# Patient Record
Sex: Female | Born: 1970 | Race: White | Hispanic: No | Marital: Married | Smoking: Never smoker
Health system: Southern US, Community
[De-identification: ages and names within clinical notes are randomized; demographics above are authoritative.]

## PROBLEM LIST (undated history)

## (undated) DIAGNOSIS — I7300 Raynaud's syndrome without gangrene: Secondary | ICD-10-CM

## (undated) HISTORY — DX: Raynaud's syndrome without gangrene: I73.00

---

## 2003-09-29 ENCOUNTER — Encounter: Payer: Self-pay | Admitting: Family Medicine

## 2006-02-16 ENCOUNTER — Other Ambulatory Visit: Admission: RE | Admit: 2006-02-16 | Discharge: 2006-02-16 | Payer: Self-pay | Admitting: Obstetrics and Gynecology

## 2006-05-01 ENCOUNTER — Encounter: Admission: RE | Admit: 2006-05-01 | Discharge: 2006-05-01 | Payer: Self-pay | Admitting: Obstetrics and Gynecology

## 2006-06-01 ENCOUNTER — Ambulatory Visit: Payer: Self-pay | Admitting: Podiatry

## 2006-09-03 ENCOUNTER — Inpatient Hospital Stay (HOSPITAL_COMMUNITY): Admission: AD | Admit: 2006-09-03 | Discharge: 2006-09-07 | Payer: Self-pay | Admitting: Obstetrics and Gynecology

## 2006-09-04 ENCOUNTER — Encounter (INDEPENDENT_AMBULATORY_CARE_PROVIDER_SITE_OTHER): Payer: Self-pay | Admitting: Specialist

## 2006-09-09 ENCOUNTER — Inpatient Hospital Stay (HOSPITAL_COMMUNITY): Admission: AD | Admit: 2006-09-09 | Discharge: 2006-09-09 | Payer: Self-pay | Admitting: Obstetrics and Gynecology

## 2007-08-05 ENCOUNTER — Ambulatory Visit: Payer: Self-pay | Admitting: Family Medicine

## 2007-08-05 DIAGNOSIS — I7300 Raynaud's syndrome without gangrene: Secondary | ICD-10-CM

## 2007-08-05 DIAGNOSIS — M25569 Pain in unspecified knee: Secondary | ICD-10-CM | POA: Insufficient documentation

## 2007-08-05 DIAGNOSIS — R062 Wheezing: Secondary | ICD-10-CM

## 2007-08-10 ENCOUNTER — Encounter: Payer: Self-pay | Admitting: Family Medicine

## 2007-09-15 ENCOUNTER — Telehealth: Payer: Self-pay | Admitting: Family Medicine

## 2008-06-22 ENCOUNTER — Inpatient Hospital Stay (HOSPITAL_COMMUNITY): Admission: RE | Admit: 2008-06-22 | Discharge: 2008-06-26 | Payer: Self-pay | Admitting: Obstetrics and Gynecology

## 2008-06-22 ENCOUNTER — Encounter (INDEPENDENT_AMBULATORY_CARE_PROVIDER_SITE_OTHER): Payer: Self-pay | Admitting: Obstetrics and Gynecology

## 2009-04-17 ENCOUNTER — Ambulatory Visit: Payer: Self-pay | Admitting: Family Medicine

## 2010-07-08 ENCOUNTER — Other Ambulatory Visit: Payer: Self-pay | Admitting: Obstetrics and Gynecology

## 2010-07-08 ENCOUNTER — Encounter (INDEPENDENT_AMBULATORY_CARE_PROVIDER_SITE_OTHER): Payer: Self-pay | Admitting: Obstetrics and Gynecology

## 2010-07-08 ENCOUNTER — Inpatient Hospital Stay (HOSPITAL_COMMUNITY): Admission: AD | Admit: 2010-07-08 | Discharge: 2010-07-11 | Payer: Self-pay | Admitting: Obstetrics and Gynecology

## 2010-09-29 ENCOUNTER — Emergency Department: Payer: Self-pay | Admitting: Emergency Medicine

## 2010-09-30 ENCOUNTER — Telehealth: Payer: Self-pay | Admitting: Family Medicine

## 2010-11-26 NOTE — Progress Notes (Signed)
Summary: call a nurse   Phone Note Call from Patient   Summary of Call: Triage Record Num: [id number] Operator: Jeraldine Loots Patient Name: Ashley [name] Call Date & Time: 09/28/2010 2:49:53PM Patient Phone: [phone] PCP: Kerby Nora Patient Gender: Female PCP Fax : [phone] Patient DOB: [birth year] Practice Name: [facility] Reason for Call: Pt calling, has had a fever x 24 hours and now it is 105 orally. Has had body aches and nasal stuffiness, fatigue. Vomited x 1. She is going to UC now for evaluation. Protocol(s) Used: Flu-Like Symptoms Recommended Outcome per Protocol: See Provider within 24 hours Reason for Outcome: Temperature of 101.5 F (38.6 C) or greater that has not responded to 24 hours of home care measures Care Advice:  ~ SYMPTOM / CONDITION MANAGEMENT  ~ Call EMS 911 if person has a change in level of consciousness. Analgesic/Antipyretic Advice - Acetaminophen: Consider acetaminophen as directed on label or by pharmacist/provider for pain or fever. PRECAUTIONS: - Use only if there is no history of liver disease, alcoholism, or intake of three or more alcohol drinks per day. - If approved by provider when breastfeeding. - Do not exceed recommended dose or frequency.  ~ Speak with a provider within 8 hours if develop flu-like symptoms and are at risk of complications, including individuals over the age of 58, women who are 3 months or more pregnant, living in a long-term care facility, or have a chronic disease (diabetes, lung, heart, or kidney) or a weakened immune system.  ~ Influenza - Transmission: - Flu virus is spread through the air in droplets when a flu-infected person coughs, sneezes or talks and another person breathes in the droplets, or by touching a surface like a door knob, telephone, or keyboard that has been contaminated by the droplets and then touching the mouth, nose, or eyes. - An individual may be passing on the flu before they  have any symptoms. - An individual may continue to be contagious with the flu for up to 7 days after the symptoms start. H1N1 influ Initial call taken by: Melody Comas,  September 30, 2010 8:59 AM  Follow-up for Phone Call        agree Hannah Beat MD  September 30, 2010 9:57 AM

## 2011-01-09 LAB — URINALYSIS, ROUTINE W REFLEX MICROSCOPIC
Bilirubin Urine: NEGATIVE
Nitrite: NEGATIVE
Specific Gravity, Urine: 1.025 (ref 1.005–1.030)

## 2011-01-09 LAB — SURGICAL PCR SCREEN
MRSA, PCR: NEGATIVE
Staphylococcus aureus: POSITIVE — AB

## 2011-01-09 LAB — CBC
HCT: 36.9 % (ref 36.0–46.0)
Hemoglobin: 12.1 g/dL (ref 12.0–15.0)
MCHC: 32.8 g/dL (ref 30.0–36.0)
MCHC: 33.7 g/dL (ref 30.0–36.0)
Platelets: 140 10*3/uL — ABNORMAL LOW (ref 150–400)
RBC: 4.49 MIL/uL (ref 3.87–5.11)
RDW: 15 % (ref 11.5–15.5)

## 2011-01-09 LAB — COMPREHENSIVE METABOLIC PANEL
ALT: 18 U/L (ref 0–35)
AST: 21 U/L (ref 0–37)
Albumin: 3 g/dL — ABNORMAL LOW (ref 3.5–5.2)
CO2: 25 mEq/L (ref 19–32)
Creatinine, Ser: 0.79 mg/dL (ref 0.4–1.2)
GFR calc Af Amer: >60 mL/min (ref >60)
GFR calc non Af Amer: >60 mL/min (ref >60)
Glucose, Bld: 78 mg/dL (ref 70–99)
Total Bilirubin: 0.3 mg/dL (ref 0.3–1.2)
Total Protein: 6.7 g/dL (ref 6.0–8.3)

## 2011-01-09 LAB — URINE MICROSCOPIC-ADD ON

## 2011-03-11 NOTE — Op Note (Signed)
NAMEANELLY, SAMARIN              ACCOUNT NO.:  [id number]   MEDICAL RECORD NO.:  [id number]          PATIENT TYPE:  INP   LOCATION:  9199                          FACILITY:  [facility]   PHYSICIAN:  Hal Morales, M.D.DATE OF BIRTH:  1970/11/16   DATE OF PROCEDURE:  06/22/2008  DATE OF DISCHARGE:                               OPERATIVE REPORT   PREOPERATIVE DIAGNOSES:  1. Twin intrauterine pregnancy at 35 weeks and 5 days.  2. Twin discordance with intrauterine growth restriction of twin B.  3. Preeclampsia  4. Positive antinuclear antibody.  5. Prior cesarean section with desire for repeat cesarean section.  6. Positive anticardiolipin antibody.   OPERATIVE DIAGNOSES:  1. Twin intrauterine pregnancy at 35 weeks and 5 days.  2. Twin discordance with intrauterine growth restriction of twin B.  3. Preeclampsia  4. Positive antinuclear antibody.  5. Prior cesarean section with desire for repeat cesarean section.  6. Positive anticardiolipin antibody.   OPERATION:  Repeat low transverse cesarean section.   SURGEON:  Hal Morales, MD   FIRST ASSISTANT:  Elby Showers. Mayford Knife, CNM   ANESTHESIA:  Spinal.   ESTIMATED BLOOD LOSS:  750 mL.   COMPLICATIONS:  None.   FINDINGS:  The patient was delivered of baby A, a girl whose name is  [place] weighing 5 pounds with Apgars of 8 and 9 at 1 and 5 minutes  respectively.  Baby B was a boy whose name is Jomarie Longs weighing 3 pounds  10 ounces with a Apgars of 8 and 9 at 1 and 5 minutes respectively.  The  uterus, tubes and ovaries were normal for the gravid state.  The lower  uterine segment was markedly thinned.  The placentas each contained an  eccentrically inserted 3-vessel cord.  However, the cord for baby B was  very-very thin   PROCEDURE:  The patient was taken to the operating room after  appropriate identification and placed on the operating table.  After the  placement of a spinal anesthetic, she was placed in the supine  position  with a left lateral tilt.  The abdomen and perineum were prepped with  multiple layers of Betadine and a Foley catheter inserted into the  bladder under sterile conditions then connected to straight drainage.  The abdomen was draped as a sterile field.  The site of the previous  cesarean section incision was infiltrated with 20 mL of 0.25% Marcaine  after assurance of adequate surgical anesthesia.  A transverse and  incision was made at that site and the abdomen opened in layers.  The  peritoneum was entered and the bladder blade placed.  The uterus was  incised approximately 2 cm above the uterovesical fold and that incision  taken laterally on either side bluntly.  Baby A had its membranes  ruptured and was delivered from the occiput transverse position.  She  had a loose nuchal cord.  This was reduced and the infant was delivered,  the cord clamped and cut, then the infant handed off to the awaiting  pediatricians.  Baby B was in an unstable lie and was delivered  after  rupture of membranes by a footling breech presentation.  His cord was  clamped and cut and he was handed off to the awaiting pediatricians.  The uterus was massaged allowing both placentas to spontaneously  separate from the uterus.  The uterine cavity was cleared of all  products of conception.  The cervix was gently dilated.  The uterine  incision was then closed with a running interlocking suture of 0 Vicryl.  An imbricating suture of the 0 Vicryl was placed with adequate  hemostasis.  Copious irrigation was carried out.  The abdominal  peritoneum was then closed with a running suture of 2-0 Vicryl.  The  rectus muscles were irrigated and made hemostatic with Bovie cautery.  The rectus fascia was closed with a running suture of 0 Vicryl then  reinforced on either side of midline with figure-of-eight sutures of 0  Vicryl.  The subcutaneous tissue was copiously irrigated and made  hemostatic with Bovie  cautery.  A subcuticular suture of 3-0 Monocryl  was used to close the skin incision.  Steri-Strips and sterile dressing  were applied.  The patient was taken from the operating room to the  recovery room in satisfactory condition having tolerated the procedure  well with sponge and instrument counts correct.   SPECIMENS TO PATHOLOGY:  Placentas from babies A and B.  The baby A went  to the full-term nursery.  Baby B was transferred to the Neonatal  Intensive Care Unit.      Hal Morales, M.D.  Electronically Signed     VPH/MEDQ  D:  06/22/2008  T:  06/23/2008  Job:  [id number]

## 2011-03-11 NOTE — H&P (Signed)
Ashley [name], Ashley [name]              ACCOUNT NO.:  [id number]   MEDICAL RECORD NO.:  [id number]          PATIENT TYPE:  INP   LOCATION:  9371                          [facility]   PHYSICIAN:  Hal Morales, M.D.DATE OF BIRTH:  11/15/1970   DATE OF ADMISSION:  06/22/2008  DATE OF DISCHARGE:                              HISTORY & PHYSICAL   This is a 40 year old gravida 2, para 1-0-0-1 at 35-5/7 weeks who  presents for C-section secondary to preeclampsia.  She had a 24-hour  urine protein showing 330 mg of protein.  She denies headache or visual  changes.  She reports positive fetal movement. Pregnancy has been  followed by Dr. Pennie Rushing and remarkable for:  1. Dichorionic-diamniotic twins.  2. First trimester spotting.  3. AMA.  4. Previous C-section.  5. Positive ANA and cardiolipin antibodies.  6. History of PIH.  7. History of Raynaud syndrome.   ALLERGIES:  None.   OB HISTORY:  Remarkable for C-section in 2007 of a female infant at 9  weeks' gestation weighing 5 pounds 7 ounces, remarkable for  nonreassuring fetal heart rate and cervical laceration.   MEDICAL HISTORY:  Remarkable for positive ANA and cardiolipin  antibodies, childhood varicella, and occasional UTI.  She had Raynaud  syndrome diagnosed in 2005.   SURGICAL HISTORY:  Remarkable for C-section in 2007.   FAMILY HISTORY:  Remarkable for grandfather and father with heart  disease; brother, father, sister with hypertension; mother with  emphysema; father with diabetes; mother and father and sister with  aneurysms.  Genetic history is remarkable for the patient's age of 80  and father with heart murmur.   SOCIAL HISTORY:  The patient is married to Hedy Camara who is involved  and supportive.  She is of the [employer].  She denies any alcohol,  tobacco, or drug use.   ESTIMATED LABS:  Hemoglobin 12.7, platelets 222.  Blood type AB  positive, antibody screen negative, RPR nonreactive, rubella immune.  Hepatitis negative, HIV negative.  Pap test normal.  Gonorrhea and  chlamydia declined.   HISTORY OF CURRENT PREGNANCY:  The patient entered care at 10 weeks'  gestation.  She was diagnosed with twins at that time that were felt to  be dichorionic-diamniotic.  She had a baseline 24-hour urine that was  normal.  She had first trimester spotting that resolved.  First  trimester screen was normal on both twins.  She was treated for reflux  at 20 weeks.  She had a normal fetal echocardiograms at 25 weeks.  She  had an elevated Glucola with a normal 3-hour GTT.  Ultrasound at 28  weeks showed a 11% difference in fetal sizes with persistence of a  weight discrepancy between babies A and B throughout the remainder of  the pregnancy.She had PIH labs done yesterday and the blood work was  normal, but the 24-hour urine was elevated for protein.   OBJECTIVE DATA:  VITAL SIGNS:  Stable.  Blood pressure is 129 to 133  over 91 to 93.  HEENT:  Within normal limits.  NECK:  Thyroid normal, not enlarged.  CHEST:  Clear to auscultation.  HEART:  Regular rate and rhythm.  ABDOMEN:  Gravid.  EFM shows fetal heart rate better reactive in both  twins with occasional contractions that are mild.  PELVIC:  Deferred.  EXTREMITIES:  Show trace edema with 2+ DTRs and no clonus.   CBC shows hemoglobin 10.4, platelets 187.  Chemistries were all normal  with a uric acid of 6.1.   ASSESSMENT:  1. Twin intrauterine pregnancy at 35-5/7 weeks.  2. Preeclampsia.   PLAN:  Admit to the operating room per Dr. Pennie Rushing, repeat low-  transverse cesarean section per Dr. Pennie Rushing, and further orders to  follow.      Marie L. Mayford Knife, C.N.M.      ______________________________  Hal Morales, M.D.    MLW/MEDQ  D:  06/22/2008  T:  06/23/2008  Job:  [id number]

## 2011-03-11 NOTE — Discharge Summary (Signed)
NAMELEILANA, [name]              ACCOUNT NO.:  [id number]   MEDICAL RECORD NO.:  [id number]          PATIENT TYPE:  INP   LOCATION:  9306                          [facility]   PHYSICIAN:  Ashley [name], M.D.DATE OF BIRTH:  1971-01-27   DATE OF ADMISSION:  06/22/2008  DATE OF DISCHARGE:  06/26/2008                               DISCHARGE SUMMARY   ADMISSION DIAGNOSES:  1. Intrauterine pregnancy twin gestation at 35-5/7 weeks.  2. Preeclampsia.   DISCHARGE DIAGNOSES:  1. Intrauterine pregnancy twin gestation at 35-5/7 weeks.  2. Preeclampsia.  3. Stable status post a primary low transverse cesarean section for      preeclampsia.  Twin discordance with intrauterine growth      retardation of twin B and delivery was baby A August 27 at 29      female infant weighing 5 pounds 1 ounce which was (2290 grams),      length 18 inches with Apgars of 8 and 9, and delivery of twin B.      Note baby girl A went to full-term nursery.  Twin B was delivered      shortly thereafter.  I do not have that time delivery and was a      viable female infant by the name of Ashley [name]. Baby A was a viable      female infant by the name of Ashley [name].  Baby B female weighed 3 pounds      10 ounces with Apgars of 8 and 9 and he was taken to the nursery      secondary to weight.  4. She is breast-feeding and pumping.  5. Anemia without hemodynamic instability.   PROCEDURES:  1. Spinal anesthesia.  2. Repeat low transverse cesarean section.   HOSPITAL COURSE:  [name] is a 40 year old gravida 2, para 1-0-0-1 who  presents on the day of admission at 35-5/7 weeks for a repeat C-section  secondary to preeclampsia.  She has 24-hour urine showing 330 mg of  protein.  At the time of admission she was denying any headaches or  visual changes or right upper quadrant pain.  Reported good fetal  movement and pregnancy followed by the MD service at [facility].  History  remarkable for:  1. Dichorionic/diamniotic  twins.  2. First trimester spotting.  3. Advanced maternal age.  4. Previous C-section.  5. Positive ANA and cardiolipin antibodies.  6. History of PIH.  7. History of Raynaud's disease.   HOSPITAL COURSE:  On the date of admission the patient was prepped for  OR and consented to proceed with a repeat cesarean section secondary to  preeclampsia.  She was taken to the OR where repeat low transverse  cesarean section was performed by Dr. Dierdre Forth with Wynelle Bourgeois, certified nurse midwife assisting.  Procedure was performed  under spinal anesthesia.  Findings were a viable female infant of baby A  by the name of Ashley [name], weight 5 pounds 1 ounce, Apgars 8 and 9 and  subsequently delivery of baby B a female infant viable by the name of  Ashley [name] weighing 3 pounds  10 ounces and also Apgars of 8 and 9.  Placenta  x2 were sent to pathology.  Estimated blood loss was 750 mL.  The  patient tolerated procedure very well.  The patient was taken to PACU in  good condition.  The baby girl A was taken to full-term nursery and baby  boy B was taken to NICU, both in stable condition.  On admission the  patient's CBC showed a white count of 6.8, hemoglobin was 10.4,  platelets were 187.  LFTs were within normal limits.  Uric acid was 6.1  and LDH was 137.  By postoperative day #2 she was feeling well, but  tired.  She was in AICU on magnesium.  Blood pressures were 124/87,  119/77, 122/91, 120/82, 120/82 and 106/74.  SAO2 was 98%.  DTRs were 3+,  no clonus, 1+ edema.  Abdomen soft, appropriately tender.  She had good  urinary output at 2700.  Labs that day; AST was stable 20, ALT was 11  and uric acid was 6.2, LDH 172, hemoglobin was down to 8.8, hematocrit  26.8, platelets were stable at 161 and white count was 8.3.  Dressing  was clean, dry and intact.  Her abdomen was appropriately tender but  soft.  Lochia was within normal limits.  She was to continue her  magnesium and discontinue on a.m.  of postoperative day #2.  The patient  did receive pastoral care visits as well as a social work consult on  postoperative day #1.  By postoperative day #2 she was feeling well.  She was ambulating, voiding spontaneously and tolerating p.o. liquids.  Her magnesium had been discontinued that morning.  She was tolerating  solid foods without difficulties and said she felt much improved after  magnesium was discontinued.  Did not have headache, visual changes,  right upper quadrant epigastric pain, weakness, shortness of breath,  chest pain or any leg pain.  She was breast feeding and pumping without  difficulty.  Twin A remained stable in newborn nursery without problems,  twin B in NICU and the patient was reporting that he was doing well.  She was afebrile.  Her vital signs were stable.  Blood pressures since  magnesium discontinued was 95/54, 119/80, 114/76, 131/91, 119/82 and  117/71.  Her physical exam was within normal and lungs were clear.  Abdomen soft, nontender.  Bowel sounds x4.  Fundus was firm below  umbilicus.  Abdominal incision was clean, dry and intact.  Small lochia  rubra.  She did not have any edema, negative Homan's.  DTRs 1+ with no  clonus.  Her weight today was 71.9 kg.  She had good urinary output.  She was transferred out to the floor.  She remained asymptomatic from  the anemia and plan was made to continue routine postoperative care.  Postoperative day #3 she continued doing well, was ambulating ad lib  without dizziness or syncope.  Pain was well managed with Motrin and one  Percocet per each request.  Breast feeding and pumping with good output.  Denied any PIH signs and symptoms.  Vital signs were afebrile and  stable.  Her blood pressure ranged on postoperative day #3 was 104-157  over 61-93.  Weight was 76.2 that day and had previously been 71.9 kg.  Physical exam was within normal limits.  She had trace edema in her  lower extremities, left was a little bit  more than her right but no  clonus and negative Homan's and DTRs were  1+.  Routine postoperative  care was to continue and thus she was started on Nu-Iron b.i.d. that day  in anticipation of discharge on June 26, 2008.  Postoperative day #4  she continued doing well, had complained of a headache the night before.  Blood pressures had been labile on the morning of postoperative day #4,  she was 98.3, 68 heart rate and blood pressure was 102/69.  Blood  pressure ranged last 24 hours had been 102-140 over diastolics of 69-92.  The patient was continuing to do well.  Lochia was scant.  She was still  up ad lib without any problems and up and down from the NICU, was  appropriately taking care of [facility] and present she was tearful about  going home and twin B still being in the NICU.  Support was given.  She  is getting good output of milk and is pumping and saving some both to  use for [facility] in NICU as well as her daughter.  Her physical exam  remained within normal limits.  The patient was deemed to have received  full benefit of her hospital stay and was discharged home in stable  condition.   PLAN FOR FOLLOW-UP:  The patient will have a blood pressure check here  at the hospital by certified nurse midwife on Wednesday and she is also  to follow up p.r.n.  Discussed with her preeclampsia signs or symptoms  and other warning signs and symptoms as well.  Other discharge  instructions were per [facility] pamphlet.   MEDICATIONS:  1. Motrin 600 mg p.o. q.6 h p.r.n. pain.  2. Percocet 5/325 1-2 tablets p.o. q.4-6 hours p.r.n. moderate to      severe pain.  3. Ferralet 90 one tablet p.o. daily for anemia.  4. Also given a prescription for [employer] [facility] prenatal vitamin 1 tablet      p.o. daily.  5. She is also to obtain stool softener of choice 1 tablet p.o. daily      and may repeat dose in p.m.  She reports she has Colace at home.   She will also have a 6-week appointment that she will call and  make  appointment to the office.  The patient was discharged home in stable  condition.      Candice Denny Levy, CNM      ______________________________  Ashley [name], M.D.    CHS/MEDQ  D:  06/26/2008  T:  06/26/2008  Job:  [id number]

## 2011-03-14 NOTE — Op Note (Signed)
Ashley [name], Ashley [name]              ACCOUNT NO.:  [id number]   MEDICAL RECORD NO.:  [id number]          PATIENT TYPE:  INP   LOCATION:  9106                          [facility]ACILITY[facility]   PHYSICIAN:  Hal Morales, M.D.DATE OF BIRTH:  November 24, 1970   DATE OF PROCEDURE:  09/04/2006  DATE OF DISCHARGE:                               OPERATIVE REPORT   PREOPERATIVE DIAGNOSES:  Intrauterine pregnancy at [redacted] weeks gestation,  positive antinuclear antibody, positive anticardiolipin antibody,  nonreassuring fetal heart rate tracing, pregnancy induced hypertension,  terminal fetal bradycardia.   POSTOPERATIVE DIAGNOSES:  Intrauterine pregnancy at [redacted] weeks gestation,  positive antinuclear antibody, positive anticardiolipin antibody,  nonreassuring fetal heart rate tracing, pregnancy induced hypertension,  terminal fetal bradycardia.   OPERATION:  Primary low transverse cesarean section with repair of  cervical laceration transabdominally and transvaginally.   SURGEON:  Hal Morales, M.D.   FIRST ASSISTANT:  Rhona Leavens, CNM   ANESTHESIA:  Epidural   ESTIMATED BLOOD LOSS:  1100 mL.   COMPLICATIONS:  Left cervical laceration.   FINDINGS:  The patient was delivered of a female infant weighing 5  pounds 7 ounces with Apgars of 8 and 9 at 1 and 5 minutes respectively.  The tubes and ovaries were normal for the gravid state.   PROCEDURE:  The patient was taken to the operating room emergently after  having had the initiation of pushing and resultant decrease in the heart  rate in the 60-70 range without recovery associated with position  changes or IV fluid bolus.  The cervix at that time was completely  dilated and the fetal vertex was between +3 and +4.  A discussion was  held with the patient concerning the possible opportunity for vacuum  assisted vaginal delivery or cesarean section and the patient opted to  proceed with cesarean section after hearing the risks and benefits.  The  patient was moved emergently to the operating suite where the heart rate  was noted to remain in the 60 to 70 range.  She had her labor epidural  in place.  She was placed on the operating table as the labor epidural  was dosed and the abdomen and perineum were prepped in multiple layers  of Betadine.  A Foley catheter was inserted into the bladder and  connected to straight drainage.  Some hematuria was noted at that time.  The abdomen was prepped with multiple layers of Betadine and draped as a  sterile field.  After assurance of adequate anesthesia, the suprapubic  region was infiltrated with 10 mL of 0.25% Marcaine.  A suprapubic  incision was made and the abdomen opened in layers.  The peritoneum was  entered and the bladder blade placed.  The uterus was incised  approximately 2 cm above the uterovesical fold and the vertex was  delivered with the aid of RN who pushed the head back into the cervix  allowing me to deliver the head from the occiput anterior position.  The  nares and pharynx were suctioned and the cord clamped and cut.  The  infant was handed off to the  awaiting pediatricians.  The placenta  separated easily from the uterus and was removed from the operative  field.  The uterine incision was noted to extend down into the left  cervical area and the cervical laceration was then closed with a running  interlocking suture of 0 Vicryl.  The uterine incision was closed with  running interlocking suture of 0 Vicryl and an imbricating suture of 0  Vicryl then placed.  Several hemostatic sutures were placed in the  cervical laceration repaired area for adequate hemostasis.  Copious  irrigation was carried out and the abdominal peritoneum was closed with  a running suture of 2-0 Vicryl.  The rectus fascia was then closed with  a running suture of 0 Vicryl and reinforced on either side of midline.  The subcutaneous tissue was made hemostatic with Bovie cautery and skin  staples  were applied to the skin incision.  A sterile dressing was  applied and the patient placed in lithotomy position for inspection of  the cervix.  A cervical laceration that measured approximately 4 cm was  noted in the left cervical region and this was repaired with interrupted  sutures in a figure-of-eight fashion of 0 Vicryl.  Hemostasis in the  area of the cervical laceration was noted.  However, there was some  bleeding from the dilated edge of the cervix.  2 inch plain vaginal  packing was placed in the vagina and the patient remained stable.  She  was then taken from the operating room to the recovery room in  satisfactory condition having tolerated the procedure well with sponge  and instrument counts correct.   SPECIMENS TO PATHOLOGY:  Placenta.      Hal Morales, M.D.  Electronically Signed     VPH/MEDQ  D:  09/04/2006  T:  09/04/2006  Job:  [id number]

## 2011-03-14 NOTE — H&P (Signed)
NAMELAVANDA, NEVELS              ACCOUNT NO.:  [id number]   MEDICAL RECORD NO.:  [id number]           PATIENT TYPE:   LOCATION:                                FACILITY:  [facility]   PHYSICIAN:  Hal Morales, M.D.DATE OF BIRTH:  [birth year]   DATE OF ADMISSION:  09/03/2006  DATE OF DISCHARGE:                              HISTORY & PHYSICAL   HISTORY:  This is a 40 year old gravida 1, para 0 at 38-4/7 weeks who  presents for induction of labor, secondary to positive ANA syndrome and  hypertension.  She has been followed by Dr. Pennie Rushing and has had a  pregnancy remarkable for:  1) Toxo risk, 2) Raynaud's disease, 3)  Positive ANA, 4) positive anticardiolipin antibodies, 5) Pregnancy  induced hypertension.   ALLERGIES:  None.   OB HISTORY:  The patient is a prima gravida.   PRENATAL LABS:  Hemoglobin 13.1, platelets 208, blood type AB positive,  antibody screen negative, RPR nonreactive, rubella immune, hepatitis  negative, HIV negative, cystic fibrosis negative, toxo negative   MEDICAL HISTORY:  Remarkable for childhood varicella.  She also has a  history of Raynaud's disease diagnosed in 2005.  History of positive ANA  and anticardiolipin antibodies, diagnosed this pregnancy; and recent  onset of hypertension.  Surgical history is remarkable for wisdom teeth  in 1995 and 1996.   FAMILY HISTORY:  Remarkable for grandmother and father with heart  disease.  Mother with emphysema.  Father has diabetes and skin cancer.  Mother and father both smoke.   GENETIC HISTORY:  Remarkable for patient's father with heart murmur.   SOCIAL HISTORY:  The patient is married to Hedy Camara who is involved  and supportive.  They both work as Scientist, research (life sciences).  She is of the  catholic faith.  She denies any alcohol, tobacco, or drug use.  History  of current pregnancy.  The patient entered care at [redacted] weeks gestation.  She had a consultation with [facility] service; and  recommendations  were made.  She had a second trimester  ultrasound which  was normal.  Thrombophilia workup was done showing positive ANA and  positive cardiolipin IgM.  She had an ultrasound at 18 weeks which was  normal; and she had a neurology referral for consultation regarding a  family history of aneurysm to evaluate the patient's risk.  She was seen  at 22 weeks and had some diarrhea issues and some reflux.  She also had  some bronchitis; and those were all treated symptomatically.  CMET was  normal at that time.  She had a fetal echo in August.  She also  fractured her right ankle in August.  Ultrasound done at 25 weeks was  normal and it was repeated at 28 weeks and was also normal showing 81  percentile growth and normal fluid.  She had a normal 1-hour Glucola at  that time.  She began having NSTs at 30 weeks and they were all  reactive.  BPP at 35 weeks was normal; and an ultrasound was also normal  showing growth at 41 percentile.  She  developed some hypertension at 37  weeks 128/84, and had PIH labs done at 38 weeks which were normal.  A 24-  hour urine was also done which showed 225 mg of protein and a creatinine  clearance of 119.  She was placed on level 3 bed rest and is being  admitted for induction.   REVIEW OF SYSTEMS:  Negative.   PHYSICAL EXAMINATION:  VITAL SIGNS:  Stable.  Last blood pressure in the  office was 116/90.  HEENT:  Within normal limits.  NECK:  Thyroid normal, not enlarged.  CHEST:  Clear to auscultation.  HEART:  Regular rate and rhythm.  ABDOMEN:  Gravid at 38 cm.  Vertex Leopold's.  Fetal heart rate reactive  per NST.  CERVICAL EXAM:  At 37 weeks was 1-2 cm, 50% effaced, and minus 2  station.  EXTREMITIES:  Within normal limits.   ASSESSMENT:  1. Intrauterine pregnancy at 38-4/7 weeks.  2. Positive ANA and cardiolipin antibodies.  3. Pregnancy induced hypertension without preeclampsia.   PLAN:  Admit for Cytotec induction with Pitocin in the a.m. and  further  orders to follow.      Marie L. Williams, C.N.M.      Hal Morales, M.D.  Electronically Signed    MLW/MEDQ  D:  09/02/2006  T:  09/02/2006  Job:  [id number]

## 2011-03-14 NOTE — Discharge Summary (Signed)
Ashley [name], Ashley [name]              ACCOUNT NO.:  [id number]   MEDICAL RECORD NO.:  [id number]          PATIENT TYPE:  INP   LOCATION:  9106                          FACILITY:  [facility]   PHYSICIAN:  Crist Fat. Rivard, M.D. DATE OF BIRTH:  1971/02/22   DATE OF ADMISSION:  09/03/2006  DATE OF DISCHARGE:  09/07/2006                                 DISCHARGE SUMMARY   ADMITTING DIAGNOSES:  1. Intrauterine pregnancy at 38 weeks.  2. Positive ANA titer positive.  3. Cardiolipin antibody.  4. Hypertension.  5. For induction of labor.   PREOP DIAGNOSES:  1. Intrauterine pregnancy at 38 weeks.  2. Positive ANA titer positive.  3. Positive cardiolipin antibody.  4. Nonreassuring fetal heart rate tracing and terminal bradycardia.   PROCEDURES:  1. Primary low transverse cesarean section.  2. Repair of cervical laceration, transabdominally and vaginally.   POSTOP DIAGNOSIS:  1. Intrauterine pregnancy at 38 weeks, delivered.  2. Positive ANA titer positive.  3. Positive cardiolipin antibody.  4. Nonreassuring fetal heart rate tracing.  5. Terminal bradycardia.  6. Primary low transverse cesarean section.  7. Repair of cervical lacerations transabdominally and vaginally.  8. Anemia.   LABS:  PIH labs were all within normal limits.   HOSPITAL COURSE:  Ashley [name] is a 40 year old gravida 1, para 0 who was  admitted for induction of labor secondary to positive ANA titer, positive  anticardiolipin antibody and labile blood pressures.  Her blood pressures  remained somewhat elevated through labor; and they were monitored closely.  She received Cytotec in the evening and Pitocin in the morning, when the  patient progressed to 7 cm, artificial rupture of membranes was employed.  Fetal heart rate variable, became severe at that point down to a nadir of  70, requiring 5 minutes to return to the baseline.  The patient did progress  with observation to 10 cm; and she started pushing, when she started  pushing  the fetal heart rate decreased to the 60s-to-70s range.  Options were  discussed with the patient by Dr. Dierdre Forth regarding vacuum versus C-  section.   The patient opted for C-section.  At that point, she pushed one more time  without significant movement; however, the fetal heart rate remained still  in the 60-70 range; and the patient was prepared for STAT cesarean section.  This was accomplished on 09/04/2006 with Dr. Dierdre Forth as surgeon.  The patient gave birth to a 5 pound 7 ounce female infant named, Chartered certified accountant. A  left cervical laceration was also noted requiring repair abdominally and  vaginally.  The vaginal packing was removed later that day.  The patient's  vital signs have remained stable.  She has been afebrile; however on the  first postoperative day her hemoglobin was noted to be 6.0   The patient was given the option of blood transfusion or observation.  She  initially was symptomatic with her anemia; however, this has improved, and  she declined transfusion.  On this her third postoperative day she is judged  to be in satisfactory condition for discharge.   DISCHARGE MEDICATIONS:  1. Motrin 600 mg p.o. q.6 h. p.r.n. pain.  2. Tylox one to two p.o. q.3-4 h. Pain.  3. Prenatal vitamins.  4. The patient will continue her iron supplement twice a day.   DISCHARGE INSTRUCTIONS:  Per [facility] handout.  She will call for  any problems or concerns.  Otherwise she will schedule her 6-week postpartum  [facility].      Rica Koyanagi, C.N.M.      Crist Fat Rivard, M.D.  Electronically Signed    SDM/MEDQ  D:  09/07/2006  T:  09/07/2006  Job:  [id number]

## 2013-09-06 ENCOUNTER — Ambulatory Visit (INDEPENDENT_AMBULATORY_CARE_PROVIDER_SITE_OTHER): Payer: Managed Care, Other (non HMO)

## 2013-09-06 ENCOUNTER — Encounter: Payer: Self-pay | Admitting: Podiatry

## 2013-09-06 ENCOUNTER — Ambulatory Visit (INDEPENDENT_AMBULATORY_CARE_PROVIDER_SITE_OTHER): Payer: Managed Care, Other (non HMO) | Admitting: Podiatry

## 2013-09-06 VITALS — BP 113/70 | HR 86 | Resp 16 | Ht 63.0 in | Wt 155.0 lb

## 2013-09-06 DIAGNOSIS — M79672 Pain in left foot: Secondary | ICD-10-CM

## 2013-09-06 DIAGNOSIS — M79609 Pain in unspecified limb: Secondary | ICD-10-CM

## 2013-09-06 DIAGNOSIS — M2010 Hallux valgus (acquired), unspecified foot: Secondary | ICD-10-CM

## 2013-09-06 NOTE — Progress Notes (Signed)
N HURT L LEFT FOOT BUNION D 30YRS O SUDDEN C WORSE A SHOES T 0

## 2013-09-06 NOTE — Progress Notes (Signed)
Subjective:     Patient ID: Ashley [name], female   DOB: November 27, 1970, 42 y.o.   MRN: [id number]  Foot Pain   patient presents stating the bunion on my left foot has been killing me for the last several years dates that she has tried wider shoes soaks anti-inflammatory's without relief of symptoms and knows that she needs to have something done  Review of Systems  All other systems reviewed and are negative.       Objective:   Physical Exam  Nursing note and vitals reviewed. Constitutional: She is oriented to person, place, and time.  Cardiovascular: Intact distal pulses.   Musculoskeletal: Normal range of motion.  Neurological: She is oriented to person, place, and time.  Skin: Skin is warm.   patient is found to have a large hyperostosis medial aspect first metatarsal head left that is painful and also red on the head of the metatarsal. The right one is minimal and not painful when pressed. 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 X-rays were reviewed and I discussed Austin bunionectomy to correct the structural deformity she has. Patient wants surgery and is going to have this fixed in December and we'll reappoint first week in December with her husband to explain everything associated with this procedure

## 2013-09-30 ENCOUNTER — Ambulatory Visit (INDEPENDENT_AMBULATORY_CARE_PROVIDER_SITE_OTHER): Payer: Managed Care, Other (non HMO) | Admitting: Podiatry

## 2013-09-30 ENCOUNTER — Encounter: Payer: Self-pay | Admitting: Podiatry

## 2013-09-30 VITALS — BP 106/74 | HR 85 | Resp 16 | Ht 63.0 in | Wt 158.0 lb

## 2013-09-30 DIAGNOSIS — M2010 Hallux valgus (acquired), unspecified foot: Secondary | ICD-10-CM

## 2013-10-01 NOTE — Progress Notes (Signed)
Subjective:     Patient ID: Ashley [name], female   DOB: [birth year], 42 y.o.   MRN: [id number]  HPI patient states I am ready to get my left bunion fixed. States it's been sore in making shoe gear or most impossible to wear   Review of Systems     Objective:   Physical Exam Neurovascular status intact with hyperostosis noted first metatarsal head left with redness and pain when palpated    Assessment:     HAV deformity left symptomatic    Plan:     Reviewed condition and discussed treatment options patient wants surgery and at this time is given a consent form for Serafina Royals which she reads Y. byline and reviewed all complications with me as listed today. She understands total recovery. We'll take 6 months to one year and that there is no long-term guarantees as far as success of surgery. Air fracture walker dispensed with all instructions on usage

## 2013-10-06 DIAGNOSIS — M2010 Hallux valgus (acquired), unspecified foot: Secondary | ICD-10-CM

## 2013-10-13 DIAGNOSIS — M2010 Hallux valgus (acquired), unspecified foot: Secondary | ICD-10-CM

## 2013-10-17 ENCOUNTER — Telehealth: Payer: Self-pay | Admitting: *Deleted

## 2013-10-17 NOTE — Telephone Encounter (Signed)
pts husband called and said toes were badly bruised, can feel and wiggle her toes fine. Told pts husband was normal. [place] Spaniel they took the ace wrap off and i told them to make sure they put it back on and elevate foot, ice and stay off foot. Did confirm appt time and date with pts husband as well.

## 2013-10-21 ENCOUNTER — Encounter: Payer: Self-pay | Admitting: Podiatry

## 2013-10-21 ENCOUNTER — Ambulatory Visit (INDEPENDENT_AMBULATORY_CARE_PROVIDER_SITE_OTHER): Payer: Managed Care, Other (non HMO)

## 2013-10-21 ENCOUNTER — Ambulatory Visit (INDEPENDENT_AMBULATORY_CARE_PROVIDER_SITE_OTHER): Payer: Managed Care, Other (non HMO) | Admitting: Podiatry

## 2013-10-21 VITALS — BP 116/72 | HR 86 | Resp 16 | Ht 63.0 in | Wt 158.0 lb

## 2013-10-21 DIAGNOSIS — M21619 Bunion of unspecified foot: Secondary | ICD-10-CM

## 2013-10-21 DIAGNOSIS — M21612 Bunion of left foot: Secondary | ICD-10-CM

## 2013-10-21 DIAGNOSIS — M2010 Hallux valgus (acquired), unspecified foot: Secondary | ICD-10-CM

## 2013-10-21 NOTE — Progress Notes (Signed)
Subjective:     Patient ID: Ashley [name], female   DOB: 1971/01/03, 42 y.o.   MRN: [id number]  HPI patient states I'm doing very well with my foot. States pain is minimal she is walking without discomfort and is utilizing elevation and ibuprofen as needed. One week after foot surgery left   Review of Systems     Objective:   Physical Exam Neurovascular status intact with no health history changes noted and well-healing first MPJ with the hallux be in rectus with good range of motion of the first MPJ    Assessment:     Healing well post Austin osteotomy first metatarsal left foot    Plan:     X-rays reviewed and advised to continue with immobilization and to begin range of motion exercises first MPJ. Sterile dressing reapplied and reappoint 3 weeks earlier if any issues should occur

## 2013-10-31 ENCOUNTER — Encounter: Payer: Self-pay | Admitting: Podiatry

## 2013-11-11 ENCOUNTER — Encounter: Payer: Managed Care, Other (non HMO) | Admitting: Podiatry

## 2013-11-16 ENCOUNTER — Encounter: Payer: Self-pay | Admitting: Podiatry

## 2013-11-18 ENCOUNTER — Encounter: Payer: Self-pay | Admitting: Podiatry

## 2013-11-18 ENCOUNTER — Ambulatory Visit (INDEPENDENT_AMBULATORY_CARE_PROVIDER_SITE_OTHER): Payer: Managed Care, Other (non HMO)

## 2013-11-18 ENCOUNTER — Ambulatory Visit (INDEPENDENT_AMBULATORY_CARE_PROVIDER_SITE_OTHER): Payer: Managed Care, Other (non HMO) | Admitting: Podiatry

## 2013-11-18 VITALS — BP 122/75 | HR 76 | Resp 16

## 2013-11-18 DIAGNOSIS — Z9889 Other specified postprocedural states: Secondary | ICD-10-CM

## 2013-11-18 DIAGNOSIS — M2010 Hallux valgus (acquired), unspecified foot: Secondary | ICD-10-CM

## 2013-11-18 NOTE — Progress Notes (Signed)
Subjective:     Patient ID: Ashley [name], female   DOB: 10/16/1971, 43 y.o.   MRN: [id number]  HPI patient states that I'm doing well with my left foot with minimal edema and no pain when walking   Review of Systems     Objective:   Physical Exam Neurovascular status intact with good range of motion first MPJ and normal amount of edema for this. Postop    Assessment:     Doing well postop Austin bunionectomy 4 weeks    Plan:     X-ray reviewed and advice on physical therapy. Compression dispensed and reappoint again in 6 weeks with gradual increase in shoe gear over that time

## 2013-11-18 NOTE — Progress Notes (Signed)
Post op 12.18.14, left foot , doing well just a little stiff

## 2013-11-22 ENCOUNTER — Encounter: Payer: Managed Care, Other (non HMO) | Admitting: Podiatry

## 2013-12-30 ENCOUNTER — Encounter: Payer: Managed Care, Other (non HMO) | Admitting: Podiatry

## 2016-02-29 ENCOUNTER — Other Ambulatory Visit: Payer: Self-pay | Admitting: Internal Medicine

## 2016-02-29 DIAGNOSIS — R519 Headache, unspecified: Secondary | ICD-10-CM

## 2016-02-29 DIAGNOSIS — R51 Headache: Principal | ICD-10-CM

## 2016-03-19 ENCOUNTER — Ambulatory Visit
Admission: RE | Admit: 2016-03-19 | Discharge: 2016-03-19 | Disposition: A | Discharge status: Home / Self Care | Payer: Managed Care, Other (non HMO) | Source: Ambulatory Visit | Attending: Internal Medicine | Admitting: Internal Medicine

## 2016-03-19 DIAGNOSIS — R51 Headache: Secondary | ICD-10-CM | POA: Diagnosis not present

## 2016-03-19 DIAGNOSIS — R519 Headache, unspecified: Secondary | ICD-10-CM

## 2017-11-27 IMAGING — MR MR MRA HEAD W/O CM
1 series · 20 of 48 positions shown · non-contrast
Comparison: MRA of the intracranial circulation performed at
[HOSPITAL] 05/01/2006.

CLINICAL DATA: Family history of aneurysm.  Headaches.

EXAM:
MRA HEAD WITHOUT CONTRAST
TECHNIQUE: Angiographic images of the Circle of Willis were obtained using MRA
technique without intravenous contrast.

[Series 2: TOF · axial · non-contrast · 0.5mm · 0.35mm/px · z∈[-46,+42]mm · 20 of 184 slices shown]
[im 1/184]
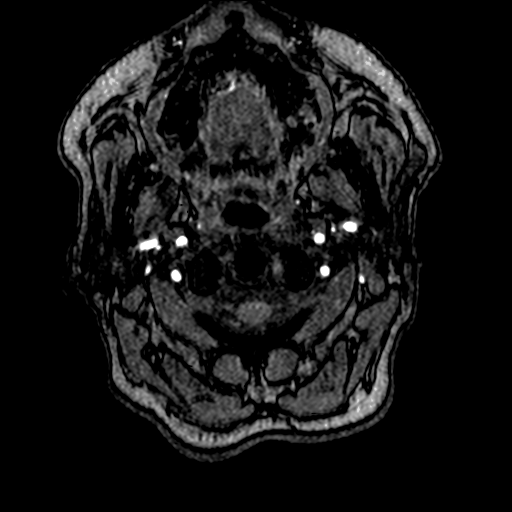
[im 4/184]
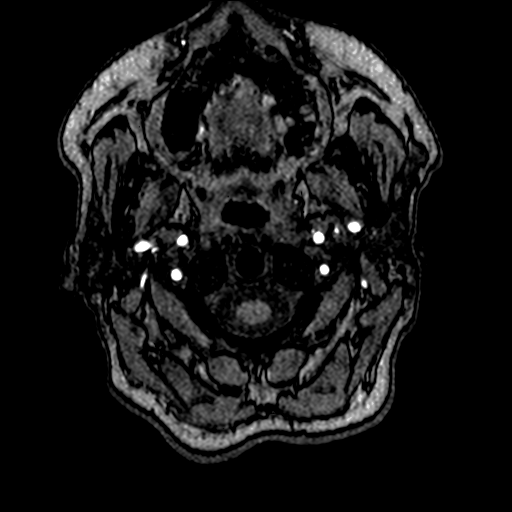
[im 8/184]
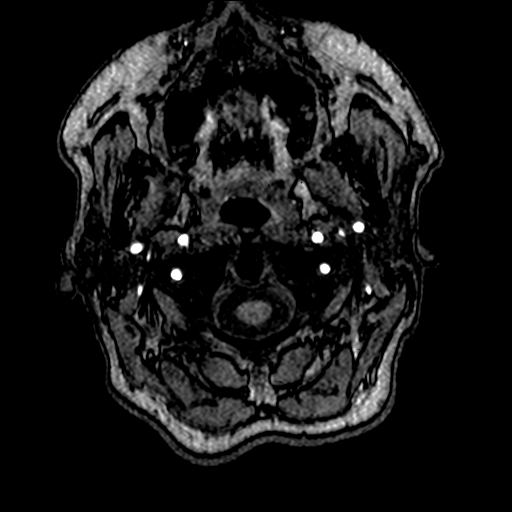
[im 12/184]
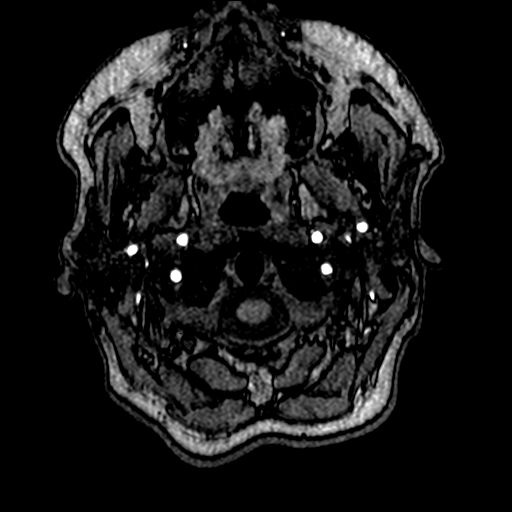
[im 16/184]
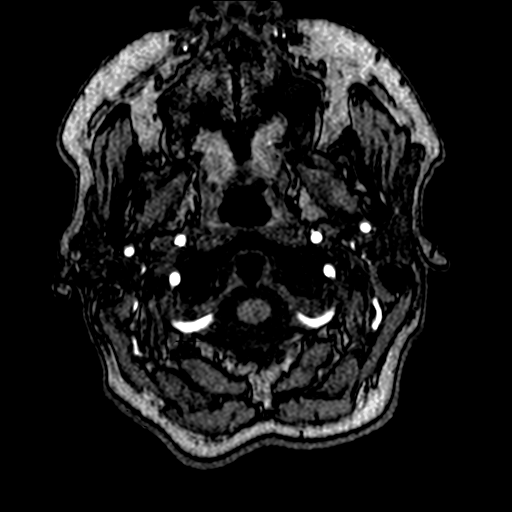
[im 20/184]
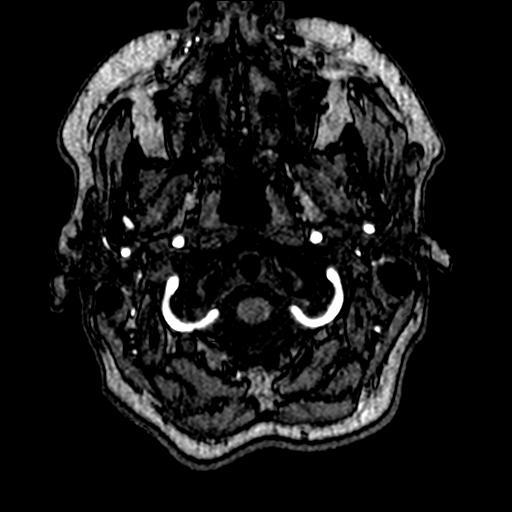
[im 24/184]
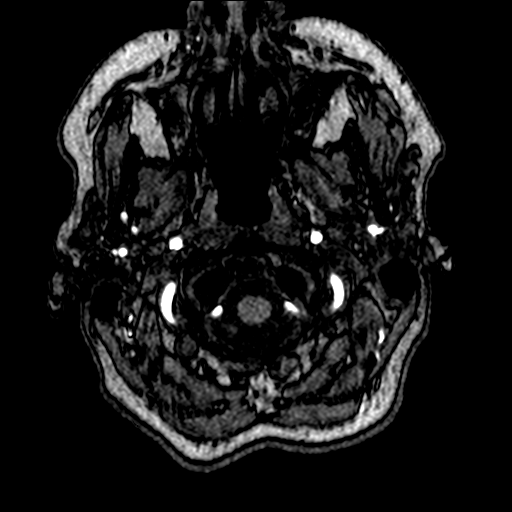
[im 28/184]
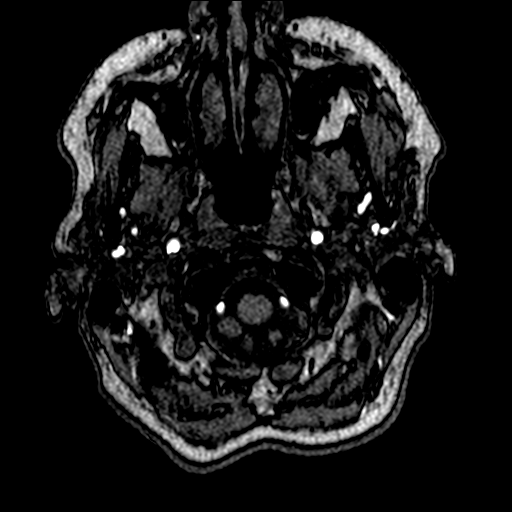
[im 32/184]
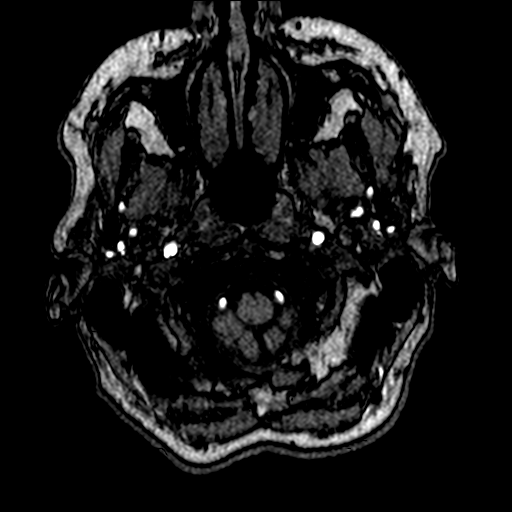
[im 36/184]
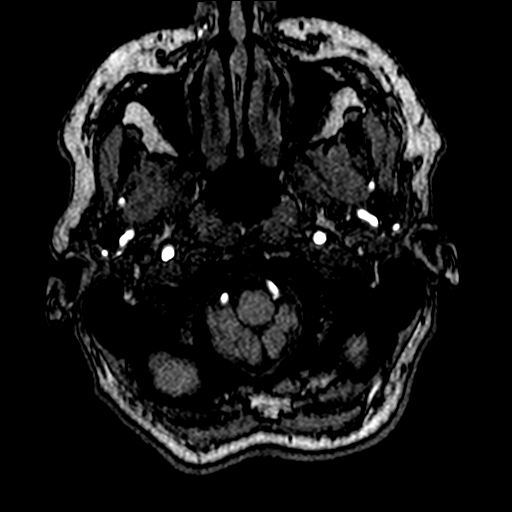
[im 39/184]
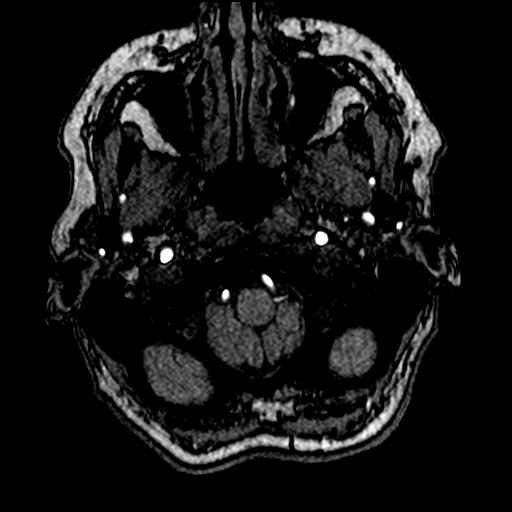
[im 43/184]
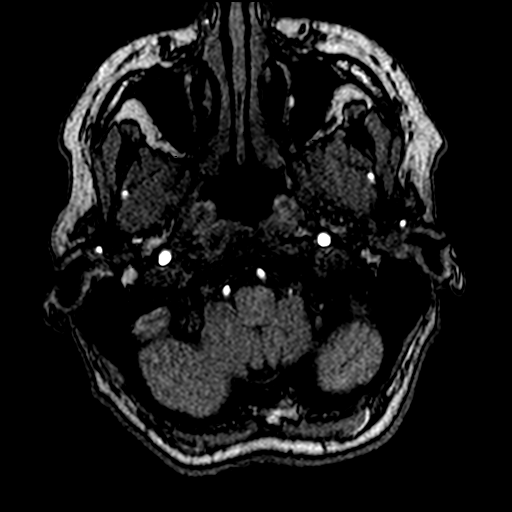
[im 59/184]
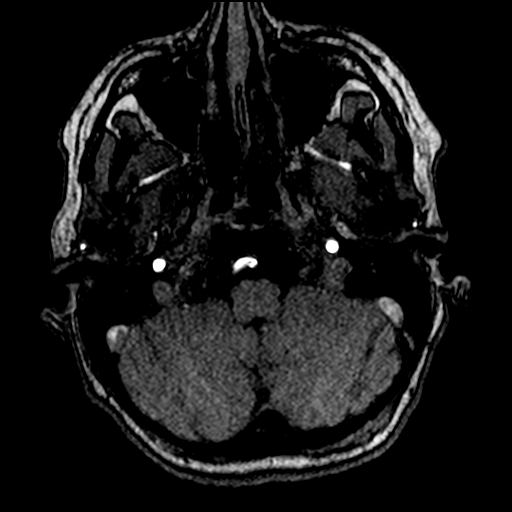
[im 82/184]
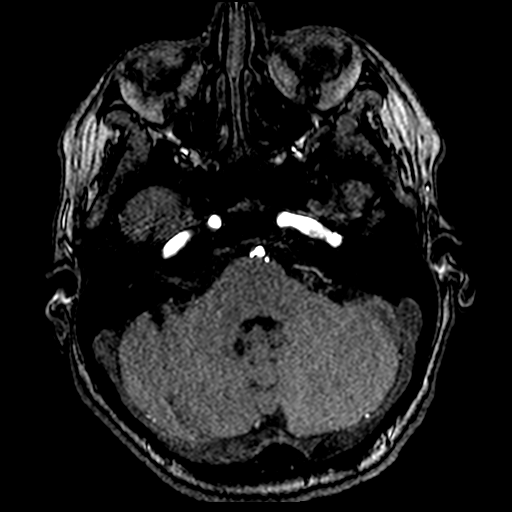
[im 94/184]
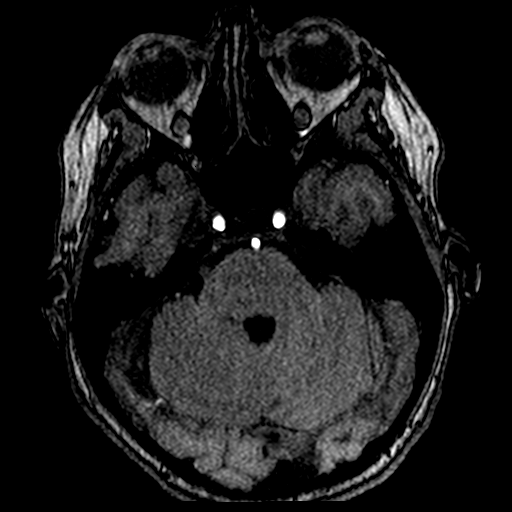
[im 106/184]
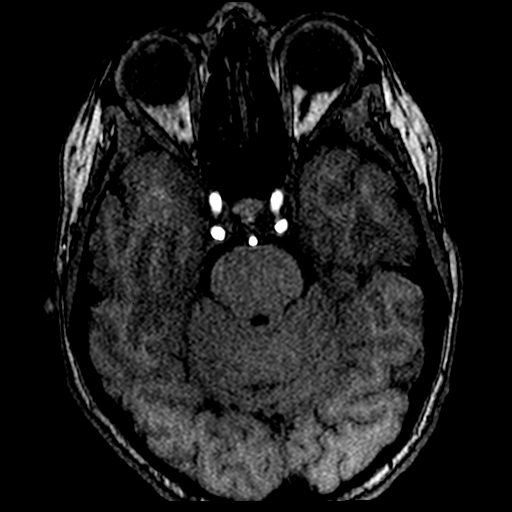
[im 129/184]
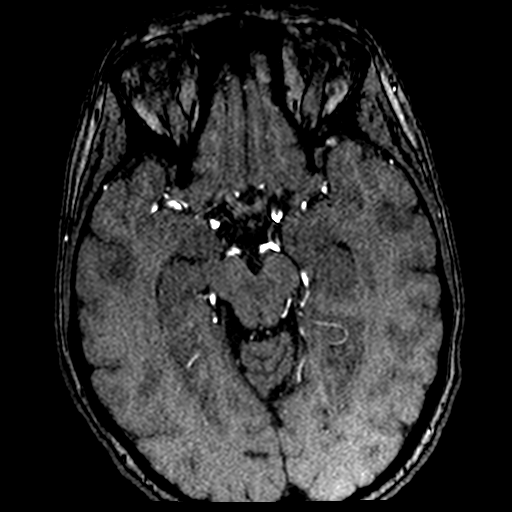
[im 152/184]
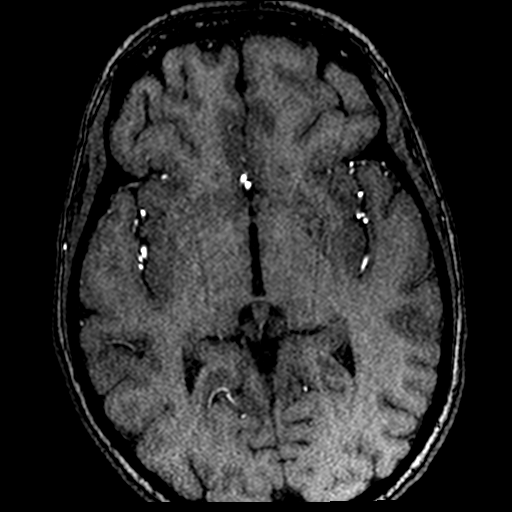
[im 156/184]
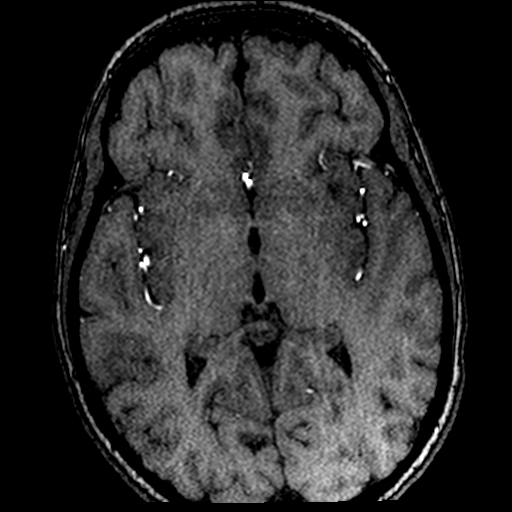
[im 176/184]
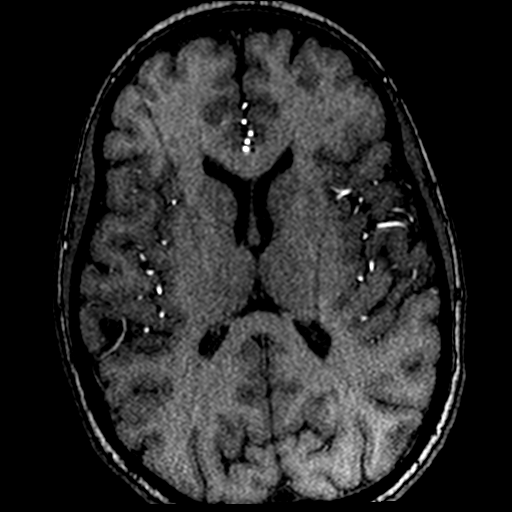

[20 of 48 positions shown; findings below may reference images not displayed]

FINDINGS: The internal carotid arteries are widely patent. The basilar artery
is widely patent with both vertebrals contributing. The RIGHT
vertebral may be slightly larger.

There is no proximal stenosis of the anterior, middle, or posterior
cerebral arteries. No cerebellar branch occlusion.

Individual intracranial branch points were examined carefully, and
there is no evidence for saccular aneurysm.

Compared with priors, there is no change.
IMPRESSION: Negative exam.

## 2018-08-17 ENCOUNTER — Encounter: Payer: Self-pay | Admitting: Nurse Practitioner

## 2021-09-13 ENCOUNTER — Encounter: Payer: Self-pay | Admitting: Internal Medicine

## 2021-09-13 NOTE — Telephone Encounter (Signed)
Spoke with pt to inform her we received her message, and that DFK will be back in on Monday. Pt said she can email her current FMLA paperwork if we need it.

## 2021-09-16 ENCOUNTER — Encounter: Payer: Self-pay | Admitting: Internal Medicine

## 2021-09-23 NOTE — Telephone Encounter (Signed)
D I have paperwork

## 2023-09-16 ENCOUNTER — Encounter: Payer: Self-pay | Admitting: Internal Medicine

## 2023-09-16 ENCOUNTER — Telehealth: Payer: 59 | Admitting: Physician Assistant

## 2023-09-16 DIAGNOSIS — B9689 Other specified bacterial agents as the cause of diseases classified elsewhere: Secondary | ICD-10-CM | POA: Diagnosis not present

## 2023-09-16 DIAGNOSIS — J069 Acute upper respiratory infection, unspecified: Secondary | ICD-10-CM

## 2023-09-16 MED ORDER — AMOXICILLIN-POT CLAVULANATE 875-125 MG PO TABS: 1.0000 | ORAL_TABLET | Freq: Two times a day (BID) | ORAL | 0 refills | Status: DC

## 2023-09-16 MED ORDER — PSEUDOEPH-BROMPHEN-DM 30-2-10 MG/5ML PO SYRP: 5.0000 mL | ORAL_SOLUTION | Freq: Four times a day (QID) | ORAL | 0 refills | Status: DC | PRN

## 2023-09-16 NOTE — Progress Notes (Signed)
Virtual Visit Consent   Ashley [name], you are scheduled for a virtual visit with a [facility] provider today. Just as with appointments in the office, your consent must be obtained to participate. Your consent will be active for this visit and any virtual visit you may have with one of our providers in the next 365 days. If you have a MyChart account, a copy of this consent can be sent to you electronically.  As this is a virtual visit, video technology does not allow for your provider to perform a traditional examination. This may limit your provider's ability to fully assess your condition. If your provider identifies any concerns that need to be evaluated in person or the need to arrange testing (such as labs, EKG, etc.), we will make arrangements to do so. Although advances in technology are sophisticated, we cannot ensure that it will always work on either your end or our end. If the connection with a video visit is poor, the visit may have to be switched to a telephone visit. With either a video or telephone visit, we are not always able to ensure that we have a secure connection.  By engaging in this virtual visit, you consent to the provision of healthcare and authorize for your insurance to be billed (if applicable) for the services provided during this visit. Depending on your insurance coverage, you may receive a charge related to this service.  I need to obtain your verbal consent now. Are you willing to proceed with your visit today? Ashley [name] has provided verbal consent on 09/16/2023 for a virtual visit (video or telephone). [name], PA-C  Date: 09/16/2023 12:50 PM  Virtual Visit via Video Note   I, [name], connected with  Ashley [name]  ([id number], 10/27/1971) on 09/16/23 at 12:45 PM EST by a video-enabled telemedicine application and verified that I am speaking with the correct person using two identifiers.  Location: Patient: Virtual Visit  Location Patient: Mobile Provider: Virtual Visit Location Provider: Home Office   I discussed the limitations of evaluation and management by telemedicine and the availability of in person appointments. The patient expressed understanding and agreed to proceed.    History of Present Illness: Ashley [name] is a 52 y.o. who identifies as a female who was assigned female at birth, and is being seen today for continued cough and congestion. Symptoms started 09/05/23. Seen at [facility] on 09/09/23. Had Covid, flu, and strep testing; all negative. Has been treating symptomatically as viral illness. Sore throat has been improving. Chest congestion, nasal congestion, and cough have progressed. Had some dry heaves yesterday.    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   WHEEZING 08/05/2007    Allergies: No Known Allergies Medications:  Current Outpatient Medications:    amoxicillin-clavulanate (AUGMENTIN) 875-125 MG tablet, Take 1 tablet by mouth 2 (two) times daily., Disp: 20 tablet, Rfl: 0   brompheniramine-pseudoephedrine-DM 30-2-10 MG/5ML syrup, Take 5 mLs by mouth 4 (four) times daily as needed., Disp: 120 mL, Rfl: 0   ibuprofen (ADVIL,MOTRIN) 200 MG tablet, Take 200 mg by mouth as needed., Disp: , Rfl:    meperidine (DEMEROL) 50 MG tablet, Take 50 mg by mouth as needed for severe pain., Disp: , Rfl:    promethazine (PHENERGAN) 25 MG tablet, Take 25 mg by mouth. Take one to two tablets by mouth every four to six hours  with demerol, Disp: , Rfl:   Observations/Objective: Patient is well-developed, well-nourished in no acute distress.  Resting comfortably Head is normocephalic, atraumatic.  No labored breathing.  Speech is clear and coherent with logical content.  Patient is alert and oriented at baseline.    Assessment and Plan: 1. Bacterial upper respiratory infection -  amoxicillin-clavulanate (AUGMENTIN) 875-125 MG tablet; Take 1 tablet by mouth 2 (two) times daily.  Dispense: 20 tablet; Refill: 0 - brompheniramine-pseudoephedrine-DM 30-2-10 MG/5ML syrup; Take 5 mLs by mouth 4 (four) times daily as needed.  Dispense: 120 mL; Refill: 0  - Worsening over a week despite OTC medications - Will treat with Augmentin and Bromfed DM - Continue tessalon perles as needed - Can continue Mucinex  - Push fluids.  - Rest.  - Steam and humidifier can help - Seek in person evaluation if worsening or symptoms fail to improve    Follow Up Instructions: I discussed the assessment and treatment plan with the patient. The patient was provided an opportunity to ask questions and all were answered. The patient agreed with the plan and demonstrated an understanding of the instructions.  A copy of instructions were sent to the patient via MyChart unless otherwise noted below.    The patient was advised to call back or seek an in-person evaluation if the symptoms worsen or if the condition fails to improve as anticipated.    [name], PA-C

## 2023-09-16 NOTE — Telephone Encounter (Signed)
Pt already [facility] and they gave her antibiotic

## 2023-09-16 NOTE — Patient Instructions (Addendum)
[name], thank you for joining [name], PA-C for today's virtual visit.  While this provider is not your primary care provider (PCP), if your PCP is located in our provider database this encounter information will be shared with them immediately following your visit.   A [facility] MyChart account gives you access to today's visit and all your visits, tests, and labs performed at [facility] " click here if you don't have a [facility] MyChart account or go to [email or web address]  Consent: (Patient) Ashley [name] provided verbal consent for this virtual visit at the beginning of the encounter.  Current Medications:  Current Outpatient Medications:    amoxicillin-clavulanate (AUGMENTIN) 875-125 MG tablet, Take 1 tablet by mouth 2 (two) times daily., Disp: 20 tablet, Rfl: 0   brompheniramine-pseudoephedrine-DM 30-2-10 MG/5ML syrup, Take 5 mLs by mouth 4 (four) times daily as needed., Disp: 120 mL, Rfl: 0   ibuprofen (ADVIL,MOTRIN) 200 MG tablet, Take 200 mg by mouth as needed., Disp: , Rfl:    meperidine (DEMEROL) 50 MG tablet, Take 50 mg by mouth as needed for severe pain., Disp: , Rfl:    promethazine (PHENERGAN) 25 MG tablet, Take 25 mg by mouth. Take one to two tablets by mouth every four to six hours with demerol, Disp: , Rfl:    Medications ordered in this encounter:  Meds ordered this encounter  Medications   amoxicillin-clavulanate (AUGMENTIN) 875-125 MG tablet    Sig: Take 1 tablet by mouth 2 (two) times daily.    Dispense:  20 tablet    Refill:  0    Order Specific Question:   Supervising Provider    Answer:   Merrilee Jansky [id number]   brompheniramine-pseudoephedrine-DM 30-2-10 MG/5ML syrup    Sig: Take 5 mLs by mouth 4 (four) times daily as needed.    Dispense:  120 mL    Refill:  0    Order Specific Question:   Supervising Provider    Answer:   Merrilee Jansky [[id number]]     *If you need refills on other medications prior to  your next appointment, please contact your pharmacy*  Follow-Up: Call back or seek an in-person evaluation if the symptoms worsen or if the condition fails to improve as anticipated.   Virtual Care [phone]  Other Instructions  Upper Respiratory Infection, Adult An upper respiratory infection (URI) is a common viral infection of the nose, throat, and upper air passages that lead to the lungs. The most common type of URI is the common cold. URIs usually get better on their own, without medical treatment. What are the causes? A URI is caused by a virus. You may catch a virus by: Breathing in droplets from an infected person's cough or sneeze. Touching something that has been exposed to the virus (is contaminated) and then touching your mouth, nose, or eyes. What increases the risk? You are more likely to get a URI if: You are very young or very old. You have close contact with others, such as at work, school, or a health care facility. You smoke. You have long-term (chronic) heart or lung disease. You have a weakened disease-fighting system (immune system). You have nasal allergies or asthma. You are experiencing a lot of stress. You have poor nutrition. What are the signs or symptoms? A URI usually involves some of the following symptoms: Runny or stuffy (congested) nose. Cough. Sneezing. Sore throat. Headache. Fatigue. Fever. Loss of appetite. Pain in your forehead, behind  your eyes, and over your cheekbones (sinus pain). Muscle aches. Redness or irritation of the eyes. Pressure in the ears or face. How is this diagnosed? This condition may be diagnosed based on your medical history and symptoms, and a physical exam. Your health care provider may use a swab to take a mucus sample from your nose (nasal swab). This sample can be tested to determine what virus is causing the illness. How is this treated? URIs usually get better on their own within 7-10 days.  Medicines cannot cure URIs, but your health care provider may recommend certain medicines to help relieve symptoms, such as: Over-the-counter cold medicines. Cough suppressants. Coughing is a type of defense against infection that helps to clear the respiratory system, so take these medicines only as recommended by your health care provider. Fever-reducing medicines. Follow these instructions at home: Activity Rest as needed. If you have a fever, stay home from work or school until your fever is gone or until your health care provider says your URI cannot spread to other people (is no longer contagious). Your health care provider may have you wear a face mask to prevent your infection from spreading. Relieving symptoms Gargle with a mixture of salt and water 3-4 times a day or as needed. To make salt water, completely dissolve -1 tsp (3-6 g) of salt in 1 cup (237 mL) of warm water. Use a cool-mist humidifier to add moisture to the air. This can help you breathe more easily. Eating and drinking  Drink enough fluid to keep your urine pale yellow. Eat soups and other clear broths. General instructions  Take over-the-counter and prescription medicines only as told by your health care provider. These include cold medicines, fever reducers, and cough suppressants. Do not use any products that contain nicotine or tobacco. These products include cigarettes, chewing tobacco, and vaping devices, such as e-cigarettes. If you need help quitting, ask your health care provider. Stay away from secondhand smoke. Stay up to date on all immunizations, including the yearly (annual) flu vaccine. Keep all follow-up visits. This is important. How to prevent the spread of infection to others URIs can be contagious. To prevent the infection from spreading: Wash your hands with soap and water for at least 20 seconds. If soap and water are not available, use hand sanitizer. Avoid touching your mouth, face, eyes, or  nose. Cough or sneeze into a tissue or your sleeve or elbow instead of into your hand or into the air.  Contact a health care provider if: You are getting worse instead of better. You have a fever or chills. Your mucus is brown or red. You have yellow or brown discharge coming from your nose. You have pain in your face, especially when you bend forward. You have swollen neck glands. You have pain while swallowing. You have white areas in the back of your throat. Get help right away if: You have shortness of breath that gets worse. You have severe or persistent: Headache. Ear pain. Sinus pain. Chest pain. You have chronic lung disease along with any of the following: Making high-pitched whistling sounds when you breathe, most often when you breathe out (wheezing). Prolonged cough (more than 14 days). Coughing up blood. A change in your usual mucus. You have a stiff neck. You have changes in your: Vision. Hearing. Thinking. Mood. These symptoms may be an emergency. Get help right away. Call 911. Do not wait to see if the symptoms will go away. Do not drive yourself to  the hospital. Summary An upper respiratory infection (URI) is a common infection of the nose, throat, and upper air passages that lead to the lungs. A URI is caused by a virus. URIs usually get better on their own within 7-10 days. Medicines cannot cure URIs, but your health care provider may recommend certain medicines to help relieve symptoms. This information is not intended to replace advice given to you by your health care provider. Make sure you discuss any questions you have with your health care provider. Document Revised: 05/15/2021 Document Reviewed: 05/15/2021 Elsevier Patient Education  2024 [employer]vier [employer]     If you have been instructed to have an in-person evaluation today at a local Urgent Care facility, please use the link below. It will take you to a list of all of our available [facility], including address, phone number and hours of operation. Please do not delay care.  [facility]  If you or a family member do not have a primary care provider, use the link below to schedule a visit and establish care. When you choose a [facility] primary care physician or advanced practice provider, you gain a long-term partner in health. Find a Primary Care Provider  Learn more about [facility]'s in-office and virtual care options: [facility] - Get Care Now

## 2024-02-27 ENCOUNTER — Encounter: Payer: Self-pay | Admitting: Internal Medicine

## 2024-02-27 ENCOUNTER — Ambulatory Visit: Admission: EM | Admit: 2024-02-27 | Discharge: 2024-02-27 | Disposition: A | Discharge status: Home / Self Care

## 2024-02-27 DIAGNOSIS — W5501XA Bitten by cat, initial encounter: Secondary | ICD-10-CM | POA: Diagnosis not present

## 2024-02-27 DIAGNOSIS — S61259A Open bite of unspecified finger without damage to nail, initial encounter: Secondary | ICD-10-CM | POA: Diagnosis not present

## 2024-02-27 DIAGNOSIS — S61451A Open bite of right hand, initial encounter: Secondary | ICD-10-CM

## 2024-02-27 DIAGNOSIS — B9689 Other specified bacterial agents as the cause of diseases classified elsewhere: Secondary | ICD-10-CM

## 2024-02-27 DIAGNOSIS — L03114 Cellulitis of left upper limb: Secondary | ICD-10-CM

## 2024-02-27 DIAGNOSIS — L089 Local infection of the skin and subcutaneous tissue, unspecified: Secondary | ICD-10-CM

## 2024-02-27 DIAGNOSIS — Z23 Encounter for immunization: Secondary | ICD-10-CM

## 2024-02-27 MED ORDER — TETANUS-DIPHTH-ACELL PERTUSSIS 5-2.5-18.5 LF-MCG/0.5 IM SUSY
0.5000 mL | PREFILLED_SYRINGE | Freq: Once | INTRAMUSCULAR | Status: AC
  Administered 2024-02-27: 0.5 mL via INTRAMUSCULAR

## 2024-02-27 MED ORDER — AMOXICILLIN-POT CLAVULANATE 875-125 MG PO TABS: 1.0000 | ORAL_TABLET | Freq: Two times a day (BID) | ORAL | 0 refills | Status: DC

## 2024-02-27 MED ORDER — MUPIROCIN 2 % EX OINT: 1.0000 | TOPICAL_OINTMENT | Freq: Two times a day (BID) | CUTANEOUS | 0 refills | Status: AC

## 2024-02-27 NOTE — ED Triage Notes (Signed)
 Patient to Urgent Care with complaints of multiple cat bites present to her left hand/ right index finger.   Incident occurred yesterday. Reports she was transferring her cat into his carrier.   TDAP unknown. Reports cat is fully vaccinated.

## 2024-02-27 NOTE — ED Provider Notes (Signed)
 [facility] Bellman    CSN: [id number] Arrival date & time: 02/27/24  0917      History   Chief Complaint Chief Complaint  Patient presents with   Animal Bite    HPI Ashley [name] is a 53 y.o. female.   Patient presents today with a 1 day history of pain involving her left dorsal hand and right index finger after being bitten by her cat.  Reports that yesterday she was trying to get the cat into a carrier when it scratched and bit her multiple times.  It is her cat and she is confident that it is up-to-date on its vaccinations.  She immediately cleaned the area with soap and water and then applied hydrogen peroxide.  She has been keeping the area clean and covered since the injury.  She is concerned because she has had increasing pain and swelling.  Pain is rated 8 on a 0 to pain scale, described as aching, no aggravating relieving factors identified.  She is unsure when her last tetanus was but reports it was a while ago; last tetanus in EMR is from 09/27/2003.  She denies any recent antibiotic.  She is right-handed.  Denies any numbness or paresthesias in her hands.    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      Home Medications    Prior to Admission medications   Medication Sig Start Date End Date Taking? Authorizing Provider  meloxicam (MOBIC) 15 MG tablet TAKE 1 TABLET BY MOUTH EVERY DAY WITH A MEAL 08/13/23  Yes [provider]  mupirocin ointment (BACTROBAN) 2 % Apply 1 Application topically 2 (two) times daily. 02/27/24  Yes Remie Mathison K, PA-C  amoxicillin -clavulanate (AUGMENTIN ) 875-125 MG tablet Take 1 tablet by mouth 2 (two) times daily. 02/27/24   Japhet Morgenthaler,  Christalynn Boise K, PA-C  brompheniramine-pseudoephedrine-DM 30-2-10 MG/5ML syrup Take 5 mLs by mouth 4 (four) times daily as needed. Patient not taking: Reported on 02/27/2024 09/16/23   Angelia Kelp, PA-C  ibuprofen (ADVIL,MOTRIN) 200 MG tablet Take 200 mg by mouth as needed.    [provider]  meperidine (DEMEROL) 50 MG tablet Take 50 mg by mouth as needed for severe pain.    [provider]  promethazine (PHENERGAN) 25 MG tablet Take 25 mg by mouth. Take one to two tablets by mouth every four to six hours with demerol    [provider]    Family History History reviewed. No pertinent family history.  Social History Social History   Tobacco Use   Smoking status: Never   Smokeless tobacco: Never  Substance Use Topics   Alcohol use: Yes    Comment: OCCASIONALLY   Drug use: No     Allergies   Patient has no known allergies.   Review of Systems Review of Systems  Constitutional:  Positive for activity change. Negative for appetite change, fatigue and fever.  Gastrointestinal:  Negative for nausea and vomiting.  Musculoskeletal:  Positive for arthralgias. Negative for myalgias.  Skin:  Positive for color change and wound.  Neurological:  Negative for weakness and numbness.     Physical Exam  Triage Vital Signs ED Triage Vitals [02/27/24 0931]  Encounter Vitals Group     BP 120/82     Systolic BP Percentile      Diastolic BP Percentile      Pulse Rate 93     Resp 18     Temp 98.1 F (36.7 C)     Temp src      SpO2 96 %     Weight      Height      Head Circumference      Peak Flow      Pain Score 8     Pain Loc      Pain Education      Exclude from Growth Chart    No data found.  Updated Vital Signs BP 120/82   Pulse 93   Temp 98.1 F (36.7 C)   Resp 18   LMP 02/24/2016   SpO2 96%   Visual Acuity Right Eye Distance:   Left Eye Distance:   Bilateral Distance:    Right Eye Near:   Left Eye Near:    Bilateral Near:      Physical Exam Vitals reviewed.  Constitutional:      General: She is awake. She is not in acute distress.    Appearance: Normal appearance. She is well-developed. She is not ill-appearing.     Comments: Very pleasant female appears stated age in no acute distress sitting comfortably in exam room  HENT:     Head: Normocephalic and atraumatic.  Cardiovascular:     Rate and Rhythm: Normal rate and regular rhythm.     Heart sounds: Normal heart sounds, S1 normal and S2 normal. No murmur heard.    Comments: Capillary refill within 2 seconds bilateral fingers Pulmonary:     Effort: Pulmonary effort is normal.     Breath sounds: Normal breath sounds. No wheezing, rhonchi or rales.     Comments: Clear to auscultation bilaterally Musculoskeletal:     Comments: Hands: Hands are neurovascular intact.  Decreased fist formation on the left secondary to swelling.   Skin:    Findings: Erythema and wound present.     Comments: Puncture wound noted to dorsal left hand with surrounding erythema measuring approximately 4 cm x 4 cm.  Multiple puncture wounds noted near right index finger PIP joint with surrounding redness.  No active bleeding or drainage noted.  Psychiatric:        Behavior: Behavior is cooperative.      UC Treatments / Results  Labs (all labs ordered are listed, but only abnormal results are displayed) Labs Reviewed - No data to display  EKG   Radiology No results found.  Procedures Procedures (including critical care time)  Medications Ordered in UC Medications  Tdap (BOOSTRIX) injection 0.5 mL (has no administration in time range)    Initial Impression / Assessment and Plan / UC Course  I have reviewed the triage vital signs and the nursing notes.  Pertinent labs & imaging results that were available during my care of the patient were reviewed by me and considered in my medical decision making (see chart for details).     Patient is well-appearing, afebrile,  nontoxic, nontachycardic.  No indication for rabies postexposure prophylaxis as patient is confident that the animal is up-to-date on its vaccines.  Tetanus was updated today.  Will start Augmentin  to cover for cellulitis associated with cat bite.  Discussed that she should keep the area clean and apply Bactroban ointment  to open wounds.  She is to demarcate the area of erythema and if this continues to spread despite antibiotic treatment she is to be seen immediately.  Recommended over-the-counter analgesics as needed for pain relief.  Discussed that if she is not significantly improved within a few days she should return for reevaluation.  If anything worsens and she has rapid expansion of erythema, streaking, fever, nausea, vomiting, decreased range of motion of the fingers, numbness or paresthesias she needs to be seen emergently.  All questions answered to patient satisfaction.  Final Clinical Impressions(s) / UC Diagnoses   Final diagnoses:  Cellulitis of left hand  Cat bite of right hand including fingers with infection, initial encounter  Cat bite, initial encounter     Discharge Instructions      We are treating you for an infection in your hand and finger related to the cat bite.  Start Augmentin  twice daily for 7 days.  Keep the areas clean and apply Bactroban ointment.  Draw line around the area of redness to monitor that it is not spreading.  If you do notice any spreading after you have been on the antibiotics for 1 to 2 days or if you have any rapid spread or streaking you should be seen immediately.  If your symptoms have not significantly improved within 1 to 2 days please return here or see your primary care.  If anything worsens and you have red streaks, increasing pain, spread of redness, swelling, fever, nausea, vomiting you need to go to the emergency room immediately.    ED Prescriptions     Medication Sig Dispense Auth. Provider   amoxicillin -clavulanate (AUGMENTIN )  875-125 MG tablet Take 1 tablet by mouth 2 (two) times daily. 14 tablet Kenyanna Grzesiak K, PA-C   mupirocin ointment (BACTROBAN) 2 % Apply 1 Application topically 2 (two) times daily. 22 g Janal Haak K, PA-C      PDMP not reviewed this encounter.   Budd Cargo, PA-C 02/27/24 1010

## 2024-02-27 NOTE — Discharge Instructions (Addendum)
 We are treating you for an infection in your hand and finger related to the cat bite.  Start Augmentin  twice daily for 7 days.  Keep the areas clean and apply Bactroban ointment.  Draw line around the area of redness to monitor that it is not spreading.  If you do notice any spreading after you have been on the antibiotics for 1 to 2 days or if you have any rapid spread or streaking you should be seen immediately.  If your symptoms have not significantly improved within 1 to 2 days please return here or see your primary care.  If anything worsens and you have red streaks, increasing pain, spread of redness, swelling, fever, nausea, vomiting you need to go to the emergency room immediately.

## 2024-02-29 ENCOUNTER — Telehealth: Payer: Self-pay | Admitting: Emergency Medicine

## 2024-02-29 MED ORDER — PREDNISONE 20 MG PO TABS: 40.0000 mg | ORAL_TABLET | Freq: Every day | ORAL | 0 refills | Status: DC

## 2024-02-29 MED ORDER — DOXYCYCLINE HYCLATE 100 MG PO CAPS: 100.0000 mg | ORAL_CAPSULE | Freq: Two times a day (BID) | ORAL | 0 refills | Status: DC

## 2024-02-29 NOTE — Telephone Encounter (Signed)
 Patient returned to clinic after being seen this past Saturday for treatment of cat scratch, currently taking Augmentin , endorses redness swelling to wounds, presentation consistent with infection as no improvement seen on Augmentin  will additionally add doxycycline and prednisone, given precautions that if no improvement seen after 3 days of additional medicine she is to go seek out additional evaluation at the nearest emergency department for evaluation of IV medications

## 2024-03-11 ENCOUNTER — Encounter: Payer: Self-pay | Admitting: Physician Assistant

## 2024-03-11 ENCOUNTER — Ambulatory Visit: Payer: Self-pay | Admitting: Physician Assistant

## 2024-03-11 VITALS — BP 110/70 | HR 82 | Temp 97.8°F | Resp 16 | Ht 63.0 in | Wt 158.0 lb

## 2024-03-11 DIAGNOSIS — Z1329 Encounter for screening for other suspected endocrine disorder: Secondary | ICD-10-CM

## 2024-03-11 DIAGNOSIS — R41840 Attention and concentration deficit: Secondary | ICD-10-CM

## 2024-03-11 DIAGNOSIS — E782 Mixed hyperlipidemia: Secondary | ICD-10-CM

## 2024-03-11 DIAGNOSIS — K219 Gastro-esophageal reflux disease without esophagitis: Secondary | ICD-10-CM | POA: Diagnosis not present

## 2024-03-11 DIAGNOSIS — R5383 Other fatigue: Secondary | ICD-10-CM

## 2024-03-11 DIAGNOSIS — Z1212 Encounter for screening for malignant neoplasm of rectum: Secondary | ICD-10-CM

## 2024-03-11 DIAGNOSIS — Z1231 Encounter for screening mammogram for malignant neoplasm of breast: Secondary | ICD-10-CM | POA: Diagnosis not present

## 2024-03-11 DIAGNOSIS — Z1211 Encounter for screening for malignant neoplasm of colon: Secondary | ICD-10-CM

## 2024-03-11 DIAGNOSIS — Z7689 Persons encountering health services in other specified circumstances: Secondary | ICD-10-CM

## 2024-03-11 NOTE — Progress Notes (Signed)
 [facility] [address] [place], [place] [address]  Internal MEDICINE  Office Visit Note  Patient Name: Ashley [name]  [id number]  [id number]  Date of Service: 03/25/2024   Complaints/HPI Pt is here for establishment of PCP. Chief Complaint  Patient presents with   New Patient (Initial Visit)   HPI Pt is here to re-establish care -has not been followed since 2018 -does have some reflux and takes famotidine OTC for this. This works well when she takes it -Has 4 kids, 17yo, 15yo twins, 13yo. Married and has 1 Merchant navy officer -working full time, works as Nurse, learning disability for [employer]. M-F. -involved in service organization and crafting as hobbies -walking for exercise daily -interested shingles vaccine -taking meloxicam for plantar fasciitis of right foot, sees [facility] -LMP 2 years ago, hot flashes less frequent now -Does have some trouble focusing, notices it more now, but thinks she had this previously. Would like formal ADHD evaluation and will refer to psych -got bit by a cat 2 weeks ago, had prednsione, augmentin  and doxycycline , and tdap. Doing better now -due for colonoscopy, mammogram and pap  Current Medication: Outpatient Encounter Medications as of 03/11/2024  Medication Sig   meloxicam (MOBIC) 15 MG tablet TAKE 1 TABLET BY MOUTH EVERY DAY WITH A MEAL   mupirocin  ointment (BACTROBAN ) 2 % Apply 1 Application topically 2 (two) times daily.   [DISCONTINUED] amoxicillin -clavulanate (AUGMENTIN ) 875-125 MG tablet Take 1 tablet by mouth 2 (two) times daily.   [DISCONTINUED] brompheniramine-pseudoephedrine-DM 30-2-10 MG/5ML syrup Take 5 mLs by mouth 4 (four) times daily as needed. (Patient not taking: Reported on 02/27/2024)   [DISCONTINUED] doxycycline  (VIBRAMYCIN ) 100 MG capsule Take 1 capsule (100 mg total) by mouth 2 (two) times daily.   [DISCONTINUED] ibuprofen (ADVIL,MOTRIN) 200 MG tablet Take 200 mg by mouth as needed.   [DISCONTINUED]  meperidine (DEMEROL) 50 MG tablet Take 50 mg by mouth as needed for severe pain.   [DISCONTINUED] predniSONE  (DELTASONE ) 20 MG tablet Take 2 tablets (40 mg total) by mouth daily.   [DISCONTINUED] promethazine (PHENERGAN) 25 MG tablet Take 25 mg by mouth. Take one to two tablets by mouth every four to six hours with demerol   No facility-administered encounter medications on file as of 03/11/2024.    Surgical History: Past Surgical History:  Procedure Laterality Date   CESAREAN SECTION     X 3    Medical History: Past Medical History:  Diagnosis Date   Raynaud's disease     Family History: Family History  Problem Relation Age of Onset   Aneurysm Mother    Hypertension Father    Aneurysm Father    Aneurysm Sister    Hypertension Sister    Aneurysm Brother    Hypertension Brother     Social History   Socioeconomic History   Marital status: Married    Spouse name: Not on file   Number of children: Not on file   Years of education: Not on file   Highest education level: Not on file  Occupational History   Not on file  Tobacco Use   Smoking status: Never    Passive exposure: Never   Smokeless tobacco: Never  Substance and Sexual Activity   Alcohol use: Yes    Comment: OCCASIONALLY   Drug use: No   Sexual activity: Not on file  Other Topics Concern   Not on file  Social History Narrative   Not on file   Social Drivers of Health  Financial Resource Strain: Not on file  Food Insecurity: Not on file  Transportation Needs: Not on file  Physical Activity: Not on file  Stress: Not on file  Social Connections: Not on file  Intimate Partner Violence: Not on file     Review of Systems  Constitutional:  Negative for chills, fatigue and unexpected weight change.  HENT:  Positive for postnasal drip. Negative for congestion, rhinorrhea, sneezing and sore throat.   Eyes:  Negative for redness.  Respiratory:  Negative for cough, chest tightness and shortness of  breath.   Cardiovascular:  Negative for chest pain and palpitations.  Gastrointestinal:  Negative for abdominal pain, constipation, diarrhea, nausea and vomiting.  Genitourinary:  Negative for dysuria and frequency.  Musculoskeletal:  Negative for arthralgias, back pain, joint swelling and neck pain.  Skin:  Negative for rash.  Neurological: Negative.  Negative for tremors and numbness.  Hematological:  Negative for adenopathy. Does not bruise/bleed easily.  Psychiatric/Behavioral:  Positive for decreased concentration. Negative for behavioral problems (Depression), sleep disturbance and suicidal ideas.     Vital Signs: BP 110/70   Pulse 82   Temp 97.8 F (36.6 C)   Resp 16   Ht 5\' 3"  (1.6 m)   Wt 158 lb (71.7 kg)   LMP 02/24/2016   SpO2 97%   BMI 27.99 kg/m    Physical Exam Vitals and nursing note reviewed.  Constitutional:      General: She is not in acute distress.    Appearance: She is well-developed. She is not diaphoretic.  HENT:     Head: Normocephalic and atraumatic.  Eyes:     Extraocular Movements: Extraocular movements intact.  Neck:     Thyroid: No thyromegaly.     Vascular: No JVD.     Trachea: No tracheal deviation.  Cardiovascular:     Rate and Rhythm: Normal rate and regular rhythm.     Heart sounds: Normal heart sounds. No murmur heard.    No friction rub. No gallop.  Pulmonary:     Effort: Pulmonary effort is normal. No respiratory distress.     Breath sounds: No wheezing or rales.  Chest:     Chest wall: No tenderness.  Musculoskeletal:        General: Normal range of motion.  Skin:    General: Skin is warm and dry.  Neurological:     Mental Status: She is alert and oriented to person, place, and time.  Psychiatric:        Behavior: Behavior normal.        Thought Content: Thought content normal.        Judgment: Judgment normal.       Assessment/Plan: 1. Decreased attention Span (Primary) Will refer to psychiatry for formal ADHD  evaluation - Ambulatory referral to Psychiatry  2. Gastroesophageal reflux disease, unspecified whether esophagitis present May continue pepcid prn  3. Encounter for colorectal cancer screening Will refer for colonoscopy - Ambulatory referral to Gastroenterology  4. Visit for screening mammogram - MM 3D SCREENING MAMMOGRAM BILATERAL BREAST; Future  5. Thyroid disorder screen - TSH + free T4  6. Mixed hyperlipidemia - Lipid Panel With LDL/HDL Ratio  7. Other fatigue - CBC w/Diff/Platelet - Comprehensive metabolic panel with GFR - TSH + free T4 - Lipid Panel With LDL/HDL Ratio  8. Encounter to establish care with new provider Will order labs and schedule CPE   General Counseling: trena dunavan understanding of the findings of todays visit and agrees with plan  of treatment. I have discussed any further diagnostic evaluation that may be needed or ordered today. We also reviewed her medications today. she has been encouraged to call the office with any questions or concerns that should arise related to todays visit.    Counseling:    Orders Placed This Encounter  Procedures   MM 3D SCREENING MAMMOGRAM BILATERAL BREAST   CBC w/Diff/Platelet   Comprehensive metabolic panel with GFR   TSH + free T4   Lipid Panel With LDL/HDL Ratio   Ambulatory referral to Gastroenterology   Ambulatory referral to Psychiatry    No orders of the defined types were placed in this encounter.    This patient was seen by Taylor Favia, PA-C in collaboration with Dr. Verneta Gone as a part of collaborative care agreement.   Time spent:35 Minutes

## 2024-03-14 ENCOUNTER — Telehealth: Payer: Self-pay | Admitting: Physician Assistant

## 2024-03-14 NOTE — Telephone Encounter (Addendum)
 Awaiting 03/11/24 office notes for psychiatry & GI referral-[facility]

## 2024-03-15 LAB — CBC WITH DIFFERENTIAL/PLATELET
Basophils Absolute: 0 10*3/uL (ref 0.0–0.2)
Basos: 1 %
EOS (ABSOLUTE): 0.2 10*3/uL (ref 0.0–0.4)
Eos: 3 %
Hematocrit: 39.6 % (ref 34.0–46.6)
Hemoglobin: 12.8 g/dL (ref 11.1–15.9)
Immature Grans (Abs): 0 10*3/uL (ref 0.0–0.1)
Immature Granulocytes: 0 %
Lymphocytes Absolute: 2.1 10*3/uL (ref 0.7–3.1)
Lymphs: 33 %
MCH: 29.2 pg (ref 26.6–33.0)
MCHC: 32.3 g/dL (ref 31.5–35.7)
MCV: 90 fL (ref 79–97)
Monocytes Absolute: 0.5 10*3/uL (ref 0.1–0.9)
Monocytes: 7 %
Neutrophils Absolute: 3.7 10*3/uL (ref 1.4–7.0)
Neutrophils: 56 %
Platelets: 226 10*3/uL (ref 150–450)
RBC: 4.38 x10E6/uL (ref 3.77–5.28)
RDW: 13.1 % (ref 11.7–15.4)
WBC: 6.5 10*3/uL (ref 3.4–10.8)

## 2024-03-15 LAB — COMPREHENSIVE METABOLIC PANEL WITH GFR
ALT: 13 IU/L (ref 0–32)
AST: 16 IU/L (ref 0–40)
Albumin: 4.1 g/dL (ref 3.8–4.9)
Alkaline Phosphatase: 79 IU/L (ref 44–121)
BUN/Creatinine Ratio: 17 (ref 9–23)
BUN: 17 mg/dL (ref 6–24)
Bilirubin Total: 0.3 mg/dL (ref 0.0–1.2)
CO2: 25 mmol/L (ref 20–29)
Calcium: 9.3 mg/dL (ref 8.7–10.2)
Chloride: 103 mmol/L (ref 96–106)
Creatinine, Ser: 1.03 mg/dL — ABNORMAL HIGH (ref 0.57–1.00)
Globulin, Total: 2.3 g/dL (ref 1.5–4.5)
Glucose: 100 mg/dL — ABNORMAL HIGH (ref 70–99)
Potassium: 4.2 mmol/L (ref 3.5–5.2)
Sodium: 141 mmol/L (ref 134–144)
Total Protein: 6.4 g/dL (ref 6.0–8.5)
eGFR: 66 mL/min/{1.73_m2} (ref >59)

## 2024-03-15 LAB — LIPID PANEL WITH LDL/HDL RATIO
Cholesterol, Total: 228 mg/dL — ABNORMAL HIGH (ref 100–199)
HDL: 58 mg/dL (ref >39)
LDL Chol Calc (NIH): 150 mg/dL — ABNORMAL HIGH (ref 0–99)
LDL/HDL Ratio: 2.6 ratio (ref 0.0–3.2)
Triglycerides: 112 mg/dL (ref 0–149)
VLDL Cholesterol Cal: 20 mg/dL (ref 5–40)

## 2024-03-15 LAB — TSH+FREE T4
Free T4: 0.97 ng/dL (ref 0.82–1.77)
TSH: 1.92 u[IU]/mL (ref 0.450–4.500)

## 2024-03-25 ENCOUNTER — Telehealth: Payer: Self-pay | Admitting: Physician Assistant

## 2024-03-25 NOTE — Telephone Encounter (Signed)
 Psychiatry referral faxed to [facility]; [phone] Lvm notifying patient. Gave pt telephone [phone]  GI referral sent via [facility] to [facility].  Lvm notifying patient. Gave pt telephone #[phone]

## 2024-03-28 ENCOUNTER — Ambulatory Visit: Payer: Self-pay | Admitting: Physician Assistant

## 2024-03-29 ENCOUNTER — Telehealth: Payer: Self-pay | Admitting: Physician Assistant

## 2024-03-29 NOTE — Telephone Encounter (Signed)
-----   Message from Jacques Mattock sent at 03/28/2024  1:56 PM EDT ----- Please let her know that her sugar was borderline and her cholesterol was elevated and to start by working on diet changes, avoiding fried foods and excess sugar/carbs. May need to consider medication for cholesterol in future. We can discuss labs further at next visit.

## 2024-03-29 NOTE — Telephone Encounter (Signed)
 LVM and sent MyChart message regarding labs.

## 2024-03-30 NOTE — Telephone Encounter (Signed)
 Error

## 2024-05-13 ENCOUNTER — Encounter: Payer: Self-pay | Admitting: Physician Assistant

## 2024-05-13 ENCOUNTER — Ambulatory Visit (INDEPENDENT_AMBULATORY_CARE_PROVIDER_SITE_OTHER): Payer: Self-pay | Admitting: Physician Assistant

## 2024-05-13 VITALS — BP 120/70 | HR 102 | Temp 98.4°F | Resp 16 | Ht 63.0 in | Wt 156.0 lb

## 2024-05-13 DIAGNOSIS — Z1231 Encounter for screening mammogram for malignant neoplasm of breast: Secondary | ICD-10-CM

## 2024-05-13 DIAGNOSIS — Z0001 Encounter for general adult medical examination with abnormal findings: Secondary | ICD-10-CM | POA: Diagnosis not present

## 2024-05-13 DIAGNOSIS — E782 Mixed hyperlipidemia: Secondary | ICD-10-CM

## 2024-05-13 DIAGNOSIS — Z124 Encounter for screening for malignant neoplasm of cervix: Secondary | ICD-10-CM

## 2024-05-13 DIAGNOSIS — Z113 Encounter for screening for infections with a predominantly sexual mode of transmission: Secondary | ICD-10-CM

## 2024-05-13 MED ORDER — ROSUVASTATIN CALCIUM 5 MG PO TABS
ORAL_TABLET | ORAL | 3 refills | Status: AC
Start: 2024-05-13 — End: ?

## 2024-05-13 NOTE — Progress Notes (Signed)
 [facility] [address] [place], [place] [address]  Internal MEDICINE  Office Visit Note  Patient Name: Ashley [name]  [id number]  [id number]  Date of Service: 05/13/2024  Chief Complaint  Patient presents with   Annual Exam    Review Labs   Quality Metric Gaps    Mammogram and Shingles vaccines     HPI Pt is here for routine health maintenance examination -doing well today -GI consult on Sept 8 for colonoscopy -shingles vaccines --send dates -labs reviewed over the phone--cholesterol high, glucose borderline  and has been making diet changes -discussed cholesterol medication to help further and she is open to this -psych referral called and stated that they do not do adult ADHD eval and will send elsewhere -mammogram due and will schedule -pap will be done today  Current Medication: Outpatient Encounter Medications as of 05/13/2024  Medication Sig   meloxicam (MOBIC) 15 MG tablet TAKE 1 TABLET BY MOUTH EVERY DAY WITH A MEAL   mupirocin  ointment (BACTROBAN ) 2 % Apply 1 Application topically 2 (two) times daily.   rosuvastatin  (CRESTOR ) 5 MG tablet Take 1 tablet by mouth twice per week   No facility-administered encounter medications on file as of 05/13/2024.    Surgical History: Past Surgical History:  Procedure Laterality Date   CESAREAN SECTION     X 3    Medical History: Past Medical History:  Diagnosis Date   Raynaud's disease     Family History: Family History  Problem Relation Age of Onset   Aneurysm Mother    Hypertension Father    Aneurysm Father    Aneurysm Sister    Hypertension Sister    Aneurysm Brother    Hypertension Brother       Review of Systems  Constitutional:  Negative for chills, fatigue and unexpected weight change.  HENT:  Positive for postnasal drip. Negative for congestion, rhinorrhea, sneezing and sore throat.   Eyes:  Negative for redness.  Respiratory:  Negative for cough, chest tightness and shortness  of breath.   Cardiovascular:  Negative for chest pain and palpitations.  Gastrointestinal:  Negative for abdominal pain, constipation, diarrhea, nausea and vomiting.  Genitourinary:  Negative for dysuria and frequency.  Musculoskeletal:  Negative for arthralgias, back pain, joint swelling and neck pain.  Skin:  Negative for rash.  Neurological: Negative.  Negative for tremors and numbness.  Hematological:  Negative for adenopathy. Does not bruise/bleed easily.  Psychiatric/Behavioral:  Positive for decreased concentration. Negative for behavioral problems (Depression), sleep disturbance and suicidal ideas.      Vital Signs: BP 120/70   Pulse (!) 102   Temp 98.4 F (36.9 C)   Resp 16   Ht 5' 3 (1.6 m)   Wt 156 lb (70.8 kg)   LMP 02/24/2016   SpO2 99%   BMI 27.63 kg/m    Physical Exam Vitals and nursing note reviewed. Exam conducted with a chaperone present.  Constitutional:      General: She is not in acute distress.    Appearance: She is well-developed. She is not diaphoretic.  HENT:     Head: Normocephalic and atraumatic.  Eyes:     Extraocular Movements: Extraocular movements intact.  Neck:     Thyroid: No thyromegaly.     Vascular: No JVD.     Trachea: No tracheal deviation.  Cardiovascular:     Rate and Rhythm: Normal rate and regular rhythm.     Heart sounds: Normal heart sounds. No murmur heard.  No friction rub. No gallop.  Pulmonary:     Effort: Pulmonary effort is normal. No respiratory distress.     Breath sounds: No wheezing or rales.  Chest:     Chest wall: No tenderness.  Abdominal:     General: Bowel sounds are normal.     Tenderness: There is no abdominal tenderness.  Genitourinary:    Exam position: Lithotomy position.     Vagina: Vaginal discharge present.     Cervix: No friability.     Comments: Pap performed Musculoskeletal:        General: Normal range of motion.  Skin:    General: Skin is warm and dry.  Neurological:     Mental  Status: She is alert and oriented to person, place, and time.  Psychiatric:        Behavior: Behavior normal.        Thought Content: Thought content normal.        Judgment: Judgment normal.      LABS: Recent Results (from the past 2160 hours)  CBC w/Diff/Platelet     Status: None   Collection Time: 03/14/24  7:20 AM  Result Value Ref Range   WBC 6.5 3.4 - 10.8 x10E3/uL   RBC 4.38 3.77 - 5.28 x10E6/uL   Hemoglobin 12.8 11.1 - 15.9 g/dL   Hematocrit 60.3 65.9 - 46.6 %   MCV 90 79 - 97 fL   MCH 29.2 26.6 - 33.0 pg   MCHC 32.3 31.5 - 35.7 g/dL   RDW 86.8 88.2 - 84.5 %   Platelets 226 150 - 450 x10E3/uL   Neutrophils 56 Not Estab. %   Lymphs 33 Not Estab. %   Monocytes 7 Not Estab. %   Eos 3 Not Estab. %   Basos 1 Not Estab. %   Neutrophils Absolute 3.7 1.4 - 7.0 x10E3/uL   Lymphocytes Absolute 2.1 0.7 - 3.1 x10E3/uL   Monocytes Absolute 0.5 0.1 - 0.9 x10E3/uL   EOS (ABSOLUTE) 0.2 0.0 - 0.4 x10E3/uL   Basophils Absolute 0.0 0.0 - 0.2 x10E3/uL   Immature Granulocytes 0 Not Estab. %   Immature Grans (Abs) 0.0 0.0 - 0.1 x10E3/uL  Comprehensive metabolic panel with GFR     Status: Abnormal   Collection Time: 03/14/24  7:20 AM  Result Value Ref Range   Glucose 100 (H) 70 - 99 mg/dL   BUN 17 6 - 24 mg/dL   Creatinine, Ser 8.96 (H) 0.57 - 1.00 mg/dL   eGFR 66 >40 fO/fpw/8.26   BUN/Creatinine Ratio 17 9 - 23   Sodium 141 134 - 144 mmol/L   Potassium 4.2 3.5 - 5.2 mmol/L   Chloride 103 96 - 106 mmol/L   CO2 25 20 - 29 mmol/L   Calcium  9.3 8.7 - 10.2 mg/dL   Total Protein 6.4 6.0 - 8.5 g/dL   Albumin 4.1 3.8 - 4.9 g/dL   Globulin, Total 2.3 1.5 - 4.5 g/dL   Bilirubin Total 0.3 0.0 - 1.2 mg/dL   Alkaline Phosphatase 79 44 - 121 IU/L   AST 16 0 - 40 IU/L   ALT 13 0 - 32 IU/L  TSH + free T4     Status: None   Collection Time: 03/14/24  7:20 AM  Result Value Ref Range   TSH 1.920 0.450 - 4.500 uIU/mL   Free T4 0.97 0.82 - 1.77 ng/dL  Lipid Panel With LDL/HDL Ratio      Status: Abnormal   Collection Time: 03/14/24  7:20  AM  Result Value Ref Range   Cholesterol, Total 228 (H) 100 - 199 mg/dL   Triglycerides 887 0 - 149 mg/dL   HDL 58 >60 mg/dL   VLDL Cholesterol Cal 20 5 - 40 mg/dL   LDL Chol Calc (NIH) 849 (H) 0 - 99 mg/dL   LDL/HDL Ratio 2.6 0.0 - 3.2 ratio    Comment:                                     LDL/HDL Ratio                                             Men  Women                               1/2 Avg.Risk  1.0    1.5                                   Avg.Risk  3.6    3.2                                2X Avg.Risk  6.2    5.0                                3X Avg.Risk  8.0    6.1   IGP, Aptima HPV     Status: None   Collection Time: 05/13/24 12:08 PM  Result Value Ref Range   Interpretation NILM     Comment: NEGATIVE FOR INTRAEPITHELIAL LESION OR MALIGNANCY.   Category NIL     Comment: Negative for Intraepithelial Lesion   Adequacy ENDO     Comment: Satisfactory for evaluation. Endocervical and/or squamous metaplastic cells (endocervical component) are present.    Clinician Provided ICD10 Comment     Comment: Z12.4   Performed by: Comment     Comment: Nancyann Remington, Cytologist (ASCP)   Note: Comment     Comment: The Pap smear is a screening test designed to aid in the detection of premalignant and malignant conditions of the uterine cervix.  It is not a diagnostic procedure and should not be used as the sole means of detecting cervical cancer.  Both false-positive and false-negative reports do occur.    Test Methodology Comment     Comment: This liquid based ThinPrep(R) pap test was interpreted using the Hologic(R) Genius(TM) Cervical Algorithm whole slide imaging system.    HPV Aptima Negative Negative    Comment: This nucleic acid amplification test detects fourteen high-risk HPV types (16,18,31,33,35,39,45,51,52,56,58,59,66,68) without differentiation.   NuSwab Vaginitis Plus (VG+)     Status: None   Collection Time:  05/13/24 12:08 PM  Result Value Ref Range   Atopobium vaginae Low - 0 Score   BVAB 2 Low - 0 Score   Megasphaera 1 Low - 0 Score    Comment: Calculate total score by adding the 3 individual bacterial vaginosis (BV) marker scores together.  Total score is interpreted as follows: Total score 0-1: Indicates the absence of BV.  Total score   2: Indeterminate for BV. Additional clinical                  data should be evaluated to establish a                  diagnosis. Total score 3-6: Indicates the presence of BV.    Candida albicans, NAA Negative Negative   Candida glabrata, NAA Negative Negative   Trich vag by NAA Negative Negative   Chlamydia trachomatis, NAA Negative Negative   Neisseria gonorrhoeae, NAA Negative Negative       Assessment/Plan: 1. Encounter for general adult medical examination with abnormal findings (Primary) CPE performed, labs reviewed, will schedule mammogram and has GI appt for colonoscopy scheduled  2. Mixed hyperlipidemia Will work on diet and exercise and start on crestor  5mg  2x/week to help - rosuvastatin  (CRESTOR ) 5 MG tablet; Take 1 tablet by mouth twice per week  Dispense: 30 tablet; Refill: 3  3. Routine cervical smear - IGP, Aptima HPV  4. Screening for STDs (sexually transmitted diseases) - NuSwab Vaginitis Plus (VG+)  5. Visit for screening mammogram - MM 3D SCREENING MAMMOGRAM BILATERAL BREAST; Future   General Counseling: Ashley [name] understanding of the findings of todays visit and agrees with plan of treatment. I have discussed any further diagnostic evaluation that may be needed or ordered today. We also reviewed her medications today. she has been encouraged to call the office with any questions or concerns that should arise related to todays visit.    Counseling:    Orders Placed This Encounter  Procedures   MM 3D SCREENING MAMMOGRAM BILATERAL BREAST   NuSwab Vaginitis Plus (VG+)    Meds ordered this encounter   Medications   rosuvastatin  (CRESTOR ) 5 MG tablet    Sig: Take 1 tablet by mouth twice per week    Dispense:  30 tablet    Refill:  3    This patient was seen by Tinnie Pro, PA-C in collaboration with Dr. Sigrid Bathe as a part of collaborative care agreement.  Total time spent:35 Minutes  Time spent includes review of chart, medications, test results, and follow up plan with the patient.     Sigrid CHRISTELLA Bathe, MD  Internal Medicine

## 2024-05-16 ENCOUNTER — Telehealth: Payer: Self-pay | Admitting: Physician Assistant

## 2024-05-16 NOTE — Telephone Encounter (Signed)
[facility] referral faxed to Dr. Maryruth Bun; [phone]. Notified patient. Gave pt telephone # [phone]

## 2024-05-17 ENCOUNTER — Ambulatory Visit: Payer: Self-pay | Admitting: Physician Assistant

## 2024-05-17 LAB — NUSWAB VAGINITIS PLUS (VG+)
Candida albicans, NAA: NEGATIVE
Candida glabrata, NAA: NEGATIVE
Chlamydia trachomatis, NAA: NEGATIVE
Neisseria gonorrhoeae, NAA: NEGATIVE
Trich vag by NAA: NEGATIVE

## 2024-05-17 LAB — IGP, APTIMA HPV: HPV Aptima: NEGATIVE

## 2024-05-17 NOTE — Progress Notes (Signed)
 Lmom to pt all us  back

## 2024-05-30 ENCOUNTER — Telehealth: Payer: Self-pay | Admitting: Physician Assistant

## 2024-05-30 NOTE — Telephone Encounter (Signed)
 Gastroenterology appointment 07/04/2024 @ [facility]

## 2024-07-07 ENCOUNTER — Encounter: Payer: Self-pay | Admitting: Gastroenterology

## 2024-07-12 ENCOUNTER — Other Ambulatory Visit: Payer: Self-pay | Admitting: Medical Genetics

## 2024-08-04 ENCOUNTER — Encounter: Payer: Self-pay | Admitting: Gastroenterology

## 2024-08-19 ENCOUNTER — Ambulatory Visit
Admission: RE | Admit: 2024-08-19 | Discharge: 2024-08-19 | Disposition: A | Discharge status: Home / Self Care | Attending: Gastroenterology | Admitting: Gastroenterology

## 2024-08-19 ENCOUNTER — Encounter: Payer: Self-pay | Admitting: Gastroenterology

## 2024-08-19 ENCOUNTER — Ambulatory Visit: Admitting: Anesthesiology

## 2024-08-19 ENCOUNTER — Encounter
Admission: RE | Disposition: A | Discharge status: Home / Self Care | Payer: Self-pay | Source: Home / Self Care | Attending: Gastroenterology

## 2024-08-19 DIAGNOSIS — D124 Benign neoplasm of descending colon: Secondary | ICD-10-CM | POA: Insufficient documentation

## 2024-08-19 DIAGNOSIS — Z1211 Encounter for screening for malignant neoplasm of colon: Secondary | ICD-10-CM | POA: Insufficient documentation

## 2024-08-19 SURGERY — COLONOSCOPY
Anesthesia: General

## 2024-08-19 MED ORDER — PROPOFOL 500 MG/50ML IV EMUL
INTRAVENOUS | Status: DC | PRN
  Administered 2024-08-19: 75 ug/kg/min via INTRAVENOUS

## 2024-08-19 MED ORDER — SODIUM CHLORIDE 0.9 % IV SOLN
INTRAVENOUS | Status: DC
  Administered 2024-08-19: 500 mL via INTRAVENOUS

## 2024-08-19 MED ORDER — DEXMEDETOMIDINE HCL IN NACL 80 MCG/20ML IV SOLN
INTRAVENOUS | Status: DC | PRN
  Administered 2024-08-19: 12 ug via INTRAVENOUS
  Administered 2024-08-19: 8 ug via INTRAVENOUS

## 2024-08-19 MED ORDER — LIDOCAINE HCL (CARDIAC) PF 100 MG/5ML IV SOSY
PREFILLED_SYRINGE | INTRAVENOUS | Status: DC | PRN
Start: 2024-08-19 — End: 2024-08-19
  Administered 2024-08-19: 60 mg via INTRAVENOUS

## 2024-08-19 MED ORDER — PROPOFOL 10 MG/ML IV BOLUS
INTRAVENOUS | Status: DC | PRN
  Administered 2024-08-19 (×2): 40 mg via INTRAVENOUS

## 2024-08-19 NOTE — Anesthesia Postprocedure Evaluation (Signed)
 Anesthesia Post Note  Patient: Ashley [name]  Procedure(s) Performed: COLONOSCOPY POLYPECTOMY, INTESTINE  Patient location during evaluation: Endoscopy Anesthesia Type: General Level of consciousness: awake and alert Pain management: pain level controlled Vital Signs Assessment: post-procedure vital signs reviewed and stable Respiratory status: spontaneous breathing, nonlabored ventilation, respiratory function stable and patient connected to nasal cannula oxygen Cardiovascular status: blood pressure returned to baseline and stable Postop Assessment: no apparent nausea or vomiting Anesthetic complications: no   No notable events documented.   Last Vitals:  Vitals:   08/19/24 0827 08/19/24 0838  BP: 100/65 106/76  Pulse:  73  Resp:  20  Temp:    SpO2:  100%    Last Pain:  Vitals:   08/19/24 0838  TempSrc:   PainSc: 0-No pain                 Lendia LITTIE Mae

## 2024-08-19 NOTE — Interval H&P Note (Signed)
 History and Physical Interval Note: Preprocedure H&P from 08/19/24  was reviewed and there was no interval change after seeing and examining the patient.  Written consent was obtained from the patient after discussion of risks, benefits, and alternatives. Patient has consented to proceed with Colonoscopy with possible intervention   08/19/2024 7:55 AM  Ashley [name]  has presented today for surgery, with the diagnosis of SCREENING COLONOSCOPY.  The various methods of treatment have been discussed with the patient and family. After consideration of risks, benefits and other options for treatment, the patient has consented to  Procedure(s): COLONOSCOPY (N/A) as a surgical intervention.  The patient's history has been reviewed, patient examined, no change in status, stable for surgery.  I have reviewed the patient's chart and labs.  Questions were answered to the patient's satisfaction.     [name]

## 2024-08-19 NOTE — H&P (Signed)
 Pre-Procedure H&P   Patient ID: Ashley [name] is a 53 y.o. female.  Gastroenterology Provider: [name], DO  Referring Provider: Tinnie Pro, PA PCP: Pro Tinnie Ashley [name]  Date: 08/19/2024  HPI Ms. Ashley [name] is a 53 y.o. female who presents today for Colonoscopy for Colorectal cancer screening .  Initial screening.  No family history of colon cancer or colon polyps.  Regular bowel moods without melena or hematochezia  Hemoglobin 12.8 MCV 90 platelets 226,000   Past Medical History:  Diagnosis Date   Raynaud's disease     Past Surgical History:  Procedure Laterality Date   BUNIONECTOMY Left    CESAREAN SECTION     X 3    Family History No h/o GI disease or malignancy  Review of Systems  Constitutional:  Negative for activity change, appetite change, chills, diaphoresis, fatigue, fever and unexpected weight change.  HENT:  Negative for trouble swallowing and voice change.   Respiratory:  Negative for shortness of breath and wheezing.   Cardiovascular:  Negative for chest pain, palpitations and leg swelling.  Gastrointestinal:  Negative for abdominal distention, abdominal pain, anal bleeding, blood in stool, constipation, diarrhea, nausea, rectal pain and vomiting.  Musculoskeletal:  Negative for arthralgias and myalgias.  Skin:  Negative for color change and pallor.  Neurological:  Negative for dizziness, syncope and weakness.  Psychiatric/Behavioral:  Negative for confusion.   All other systems reviewed and are negative.    Medications No current facility-administered medications on file prior to encounter.   Current Outpatient Medications on File Prior to Encounter  Medication Sig Dispense Refill   meloxicam (MOBIC) 15 MG tablet TAKE 1 TABLET BY MOUTH EVERY DAY WITH A MEAL     mupirocin  ointment (BACTROBAN ) 2 % Apply 1 Application topically 2 (two) times daily. 22 g 0   rosuvastatin  (CRESTOR ) 5 MG tablet Take 1 tablet by mouth  twice per week 30 tablet 3    Pertinent medications related to GI and procedure were reviewed by me with the patient prior to the procedure   Current Facility-Administered Medications:    0.9 %  sodium chloride infusion, , Intravenous, Continuous, Jungling Elspeth Ozell, DO, Last Rate: 20 mL/hr at 08/19/24 0753, 500 mL at 08/19/24 0753  sodium chloride 500 mL (08/19/24 0753)       No Known Allergies Allergies were reviewed by me prior to the procedure  Objective   Body mass index is 27.62 kg/m. Vitals:   08/19/24 0750  BP: 124/89  Pulse: 79  Resp: 18  Temp: (!) 97.5 F (36.4 C)  TempSrc: Temporal  SpO2: 100%  Weight: 68.5 kg  Height: 5' 2 (1.575 m)    Physical Exam Vitals and nursing note reviewed.  Constitutional:      General: She is not in acute distress.    Appearance: Normal appearance. She is not ill-appearing, toxic-appearing or diaphoretic.  HENT:     Head: Normocephalic and atraumatic.     Nose: Nose normal.     Mouth/Throat:     Mouth: Mucous membranes are moist.     Pharynx: Oropharynx is clear.  Eyes:     General: No scleral icterus.    Extraocular Movements: Extraocular movements intact.  Cardiovascular:     Rate and Rhythm: Normal rate and regular rhythm.     Heart sounds: Normal heart sounds. No murmur heard.    No friction rub. No gallop.  Pulmonary:     Effort: Pulmonary effort is normal. No  respiratory distress.     Breath sounds: Normal breath sounds. No wheezing, rhonchi or rales.  Abdominal:     General: Bowel sounds are normal. There is no distension.     Palpations: Abdomen is soft.     Tenderness: There is no abdominal tenderness. There is no guarding or rebound.  Musculoskeletal:     Cervical back: Neck supple.     Right lower leg: No edema.     Left lower leg: No edema.  Skin:    General: Skin is warm and dry.     Coloration: Skin is not jaundiced or pale.  Neurological:     General: No focal deficit present.     Mental  Status: She is alert and oriented to person, place, and time. Mental status is at baseline.  Psychiatric:        Mood and Affect: Mood normal.        Behavior: Behavior normal.        Thought Content: Thought content normal.        Judgment: Judgment normal.      Assessment:  Ms. Ashley [name] is a 53 y.o. female  who presents today for Colonoscopy for Colorectal cancer screening .  Plan:  Colonoscopy with possible intervention today  Colonoscopy with possible biopsy, control of bleeding, polypectomy, and interventions as necessary has been discussed with the patient/patient representative. Informed consent was obtained from the patient/patient representative after explaining the indication, nature, and risks of the procedure including but not limited to death, bleeding, perforation, missed neoplasm/lesions, cardiorespiratory compromise, and reaction to medications. Opportunity for questions was given and appropriate answers were provided. Patient/patient representative has verbalized understanding is amenable to undergoing the procedure.   [name], DO  [facility]  Portions of the record may have been created with voice recognition software. Occasional wrong-word or 'sound-a-like' substitutions may have occurred due to the inherent limitations of voice recognition software.  Read the chart carefully and recognize, using context, where substitutions may have occurred.

## 2024-08-19 NOTE — Op Note (Signed)
 [facility] Gastroenterology Patient Name: Ashley [name] Procedure Date: 08/19/2024 7:59 AM MRN: [id number] Account #: [id number] Date of Birth: 1971-01-19 Admit Type: Outpatient Age: 53 Room: [facility] ENDO ROOM 1 Gender: Female Note Status: Finalized Instrument Name: Colon Scope [phone] Procedure:             Colonoscopy Indications:           Screening for colorectal malignant neoplasm Providers:             [name] DO, DO Referring MD:          Sigrid EMERSON Bathe, MD (Referring MD) Medicines:             Monitored Anesthesia Care Complications:         No immediate complications. Estimated blood loss:                         Minimal. Procedure:             Pre-Anesthesia Assessment:                        - Prior to the procedure, a History and Physical was                         performed, and patient medications and allergies were                         reviewed. The patient is competent. The risks and                         benefits of the procedure and the sedation options and                         risks were discussed with the patient. All questions                         were answered and informed consent was obtained.                         Patient identification and proposed procedure were                         verified by the physician, the nurse, the anesthetist                         and the technician in the endoscopy suite. Mental                         Status Examination: alert and oriented. Airway                         Examination: normal oropharyngeal airway and neck                         mobility. Respiratory Examination: clear to                         auscultation. CV Examination: RRR, no murmurs, no S3  or S4. Prophylactic Antibiotics: The patient does not                         require prophylactic antibiotics. Prior                         Anticoagulants: The patient has taken no anticoagulant                          or antiplatelet agents. ASA Grade Assessment: I - A                         normal, healthy patient. After reviewing the risks and                         benefits, the patient was deemed in satisfactory                         condition to undergo the procedure. The anesthesia                         plan was to use monitored anesthesia care (MAC).                         Immediately prior to administration of medications,                         the patient was re-assessed for adequacy to receive                         sedatives. The heart rate, respiratory rate, oxygen                         saturations, blood pressure, adequacy of pulmonary                         ventilation, and response to care were monitored                         throughout the procedure. The physical status of the                         patient was re-assessed after the procedure.                        After obtaining informed consent, the colonoscope was                         passed under direct vision. Throughout the procedure,                         the patient's blood pressure, pulse, and oxygen                         saturations were monitored continuously. The                         Colonoscope was introduced through the anus and  advanced to the the cecum, identified by appendiceal                         orifice and ileocecal valve. The colonoscopy was                         performed without difficulty. The patient tolerated                         the procedure well. The quality of the bowel                         preparation was evaluated using the BBPS [facility] Bowel                         Preparation Scale) with scores of: Right Colon = 3,                         Transverse Colon = 3 and Left Colon = 3 (entire mucosa                         seen well with no residual staining, small fragments                         of stool or opaque liquid).  The total BBPS score                         equals 9. The ileocecal valve, appendiceal orifice,                         and rectum were photographed. Findings:      The perianal and digital rectal examinations were normal. Pertinent       negatives include normal sphincter tone.      A 1 to 2 mm polyp was found in the descending colon. The polyp was       sessile. The polyp was removed with a jumbo cold forceps. Resection and       retrieval were complete. Estimated blood loss was minimal.      The exam was otherwise without abnormality on direct and retroflexion       views. Impression:            - One 1 to 2 mm polyp in the descending colon, removed                         with a jumbo cold forceps. Resected and retrieved.                        - The examination was otherwise normal on direct and                         retroflexion views. Recommendation:        - Patient has a contact number available for                         emergencies. The signs and symptoms of potential  delayed complications were discussed with the patient.                         Return to normal activities tomorrow. Written                         discharge instructions were provided to the patient.                        - Discharge patient to home.                        - Resume previous diet.                        - Continue present medications.                        - Await pathology results.                        - Repeat colonoscopy for surveillance based on                         pathology results.                        - Return to referring physician as previously                         scheduled.                        - The findings and recommendations were discussed with                         the patient. Procedure Code(s):     --- Professional ---                        [phone], Colonoscopy, flexible; with biopsy, single or                          multiple Diagnosis Code(s):     --- Professional ---                        Z12.11, Encounter for screening for malignant neoplasm                         of colon                        D12.4, Benign neoplasm of descending colon CPT copyright 2022 American Medical Association. All rights reserved. The codes documented in this report are preliminary and upon coder review may  be revised to meet current compliance requirements. Attending Participation:      I personally performed the entire procedure. Elspeth Jungling, DO [name] DO, DO 08/19/2024 8:24:15 AM This report has been signed electronically. Number of Addenda: 0 Note Initiated On: 08/19/2024 7:59 AM Scope Withdrawal Time: 0 hours 9 minutes 13 seconds  Total Procedure Duration: 0 hours 12 minutes 58 seconds  Estimated Blood Loss:  Estimated blood  loss was minimal.      [facility]

## 2024-08-19 NOTE — Anesthesia Preprocedure Evaluation (Signed)
 Anesthesia Evaluation  Patient identified by MRN, date of birth, ID band Patient awake    Reviewed: Allergy & Precautions, NPO status , Patient's Chart, lab work & pertinent test results  History of Anesthesia Complications Negative for: history of anesthetic complications  Airway Mallampati: III  TM Distance: >3 FB Neck ROM: full    Dental no notable dental hx.    Pulmonary neg pulmonary ROS   Pulmonary exam normal        Cardiovascular negative cardio ROS Normal cardiovascular exam     Neuro/Psych negative neurological ROS  negative psych ROS   GI/Hepatic negative GI ROS, Neg liver ROS,,,  Endo/Other  negative endocrine ROS    Renal/GU negative Renal ROS  negative genitourinary   Musculoskeletal   Abdominal   Peds  Hematology negative hematology ROS (+)   Anesthesia Other Findings Past Medical History: No date: Raynaud's disease  Past Surgical History: No date: BUNIONECTOMY; Left No date: CESAREAN SECTION     Comment:  X 3  BMI    Body Mass Index: 27.62 kg/m      Reproductive/Obstetrics negative OB ROS                              Anesthesia Physical Anesthesia Plan  ASA: 1  Anesthesia Plan: General   Post-op Pain Management: Minimal or no pain anticipated   Induction: Intravenous  PONV Risk Score and Plan: 2 and Propofol infusion and TIVA  Airway Management Planned: Natural Airway and Nasal Cannula  Additional Equipment:   Intra-op Plan:   Post-operative Plan:   Informed Consent: I have reviewed the patients History and Physical, chart, labs and discussed the procedure including the risks, benefits and alternatives for the proposed anesthesia with the patient or authorized representative who has indicated his/her understanding and acceptance.     Dental Advisory Given  Plan Discussed with: Anesthesiologist, CRNA and Surgeon  Anesthesia Plan Comments:  (Patient consented for risks of anesthesia including but not limited to:  - adverse reactions to medications - risk of airway placement if required - damage to eyes, teeth, lips or other oral mucosa - nerve damage due to positioning  - sore throat or hoarseness - Damage to heart, brain, nerves, lungs, other parts of body or loss of life  Patient voiced understanding and assent.)        Anesthesia Quick Evaluation

## 2024-08-19 NOTE — Transfer of Care (Signed)
 Immediate Anesthesia Transfer of Care Note  Patient: Ashley [name]  Procedure(s) Performed: COLONOSCOPY  Patient Location: PACU  Anesthesia Type:General  Level of Consciousness: sedated  Airway & Oxygen Therapy: Patient Spontanous Breathing  Post-op Assessment: Report given to RN and Post -op Vital signs reviewed and stable  Post vital signs: Reviewed and stable  Last Vitals:  Vitals Value Taken Time  BP 77/58 08/19/24 08:24  Temp    Pulse 61 08/19/24 08:24  Resp 17 08/19/24 08:24  SpO2 99 % 08/19/24 08:24    Last Pain:  Vitals:   08/19/24 0824  TempSrc:   PainSc: Asleep         Complications: No notable events documented.

## 2024-08-22 LAB — SURGICAL PATHOLOGY

## 2024-11-11 ENCOUNTER — Ambulatory Visit: Admitting: Physician Assistant

## 2024-11-18 ENCOUNTER — Ambulatory Visit: Admitting: Physician Assistant

## 2024-11-18 ENCOUNTER — Encounter: Payer: Self-pay | Admitting: Physician Assistant

## 2024-11-18 VITALS — BP 110/70 | HR 80 | Temp 97.8°F | Resp 16 | Ht 63.0 in | Wt 155.0 lb

## 2024-11-18 DIAGNOSIS — R42 Dizziness and giddiness: Secondary | ICD-10-CM | POA: Diagnosis not present

## 2024-11-18 DIAGNOSIS — Z1329 Encounter for screening for other suspected endocrine disorder: Secondary | ICD-10-CM | POA: Diagnosis not present

## 2024-11-18 DIAGNOSIS — E538 Deficiency of other specified B group vitamins: Secondary | ICD-10-CM | POA: Diagnosis not present

## 2024-11-18 DIAGNOSIS — Z8249 Family history of ischemic heart disease and other diseases of the circulatory system: Secondary | ICD-10-CM

## 2024-11-18 DIAGNOSIS — R5383 Other fatigue: Secondary | ICD-10-CM | POA: Diagnosis not present

## 2024-11-18 DIAGNOSIS — E782 Mixed hyperlipidemia: Secondary | ICD-10-CM

## 2024-11-18 NOTE — Progress Notes (Signed)
 [facility] [address] [place], [place] [address]  Internal MEDICINE  Office Visit Note  Patient Name: Ashley [name]  [id number]  [id number]  Date of Service: 11/18/2024  Chief Complaint  Patient presents with   Follow-up    HPI Pt is here for routine follow uip -colonoscopy done in Oct 2025, repeat 74yr due to polyp -mammogram still needs to be scheduled -did see psychiatrist and started zoloft, then did ADHD testing and was started on vyvanse just 2 weeks ago. Doing ok with this -not taking crestor  yet and is hoping to make lifestyle changes instead -recovered from flu before christmas -taking B12 OTC after labs with psych showed this borderline low -When speaking with attention specialist about Fhx of brain aneurysm they suggested addressing this again. Gets a little woosy/dizzy at times. No syncope or many headaches. Has not had MRI in 9years. Mom, dad, brother and sister all have this though and 3/4 had it rupture.  Current Medication: Outpatient Encounter Medications as of 11/18/2024  Medication Sig Note   lisdexamfetamine (VYVANSE) 20 MG capsule Take 20 mg by mouth daily.    meloxicam (MOBIC) 15 MG tablet TAKE 1 TABLET BY MOUTH EVERY DAY WITH A MEAL    mupirocin  ointment (BACTROBAN ) 2 % Apply 1 Application topically 2 (two) times daily.    rosuvastatin  (CRESTOR ) 5 MG tablet Take 1 tablet by mouth twice per week 08/19/2024: HAS NOT STARTED YET   sertraline (ZOLOFT) 50 MG tablet Take 50 mg by mouth every morning.    No facility-administered encounter medications on file as of 11/18/2024.    Surgical History: Past Surgical History:  Procedure Laterality Date   BUNIONECTOMY Left    CESAREAN SECTION     X 3   COLONOSCOPY N/A 08/19/2024   Procedure: COLONOSCOPY;  Surgeon: Onita Elspeth Sharper, DO;  Location: [facility] ENDOSCOPY;  Service: Gastroenterology;  Laterality: N/A;   POLYPECTOMY  08/19/2024   Procedure: POLYPECTOMY, INTESTINE;  Surgeon: Onita Elspeth Sharper, DO;  Location: [facility] ENDOSCOPY;  Service: Gastroenterology;;    Medical History: Past Medical History:  Diagnosis Date   Raynaud's disease     Family History: Family History  Problem Relation Age of Onset   Aneurysm Mother    Hypertension Father    Aneurysm Father    Aneurysm Sister    Hypertension Sister    Aneurysm Brother    Hypertension Brother     Social History   Socioeconomic History   Marital status: Married    Spouse name: Not on file   Number of children: Not on file   Years of education: Not on file   Highest education level: Not on file  Occupational History   Not on file  Tobacco Use   Smoking status: Never    Passive exposure: Never   Smokeless tobacco: Never  Vaping Use   Vaping status: Never Used  Substance and Sexual Activity   Alcohol use: Yes    Comment: OCCASIONALLY   Drug use: No   Sexual activity: Not on file  Other Topics Concern   Not on file  Social History Narrative   Not on file   Social Drivers of Health   Tobacco Use: Low Risk (11/18/2024)   Patient History    Smoking Tobacco Use: Never    Smokeless Tobacco Use: Never    Passive Exposure: Never  Financial Resource Strain: Not on file  Food Insecurity: Not on file  Transportation Needs: Not on file  Physical Activity: Not  on file  Stress: Not on file  Social Connections: Not on file  Intimate Partner Violence: Not on file  Depression [phone]): Low Risk (11/18/2024)   Depression (PHQ2-9)    PHQ-2 Score: 0  Alcohol Screen: Not on file  Housing: Not on file  Utilities: Not on file  Health Literacy: Not on file      Review of Systems  Constitutional:  Negative for chills, fatigue and unexpected weight change.  HENT:  Positive for postnasal drip. Negative for congestion, rhinorrhea, sneezing and sore throat.   Eyes:  Negative for redness.  Respiratory:  Negative for cough, chest tightness and shortness of breath.   Cardiovascular:  Negative for chest pain and  palpitations.  Gastrointestinal:  Negative for abdominal pain, constipation, diarrhea, nausea and vomiting.  Genitourinary:  Negative for dysuria and frequency.  Musculoskeletal:  Negative for arthralgias, back pain, joint swelling and neck pain.  Skin:  Negative for rash.  Neurological:  Positive for light-headedness. Negative for tremors and numbness.  Hematological:  Negative for adenopathy. Does not bruise/bleed easily.  Psychiatric/Behavioral:  Negative for behavioral problems (Depression), sleep disturbance and suicidal ideas.     Vital Signs: BP 110/70   Pulse 80   Temp 97.8 F (36.6 C)   Resp 16   Ht 5' 3 (1.6 m)   Wt 155 lb (70.3 kg)   LMP 02/24/2016   SpO2 98%   BMI 27.46 kg/m    Physical Exam Vitals and nursing note reviewed.  Constitutional:      General: She is not in acute distress.    Appearance: She is well-developed. She is not diaphoretic.  HENT:     Head: Normocephalic and atraumatic.  Eyes:     Extraocular Movements: Extraocular movements intact.  Neck:     Thyroid: No thyromegaly.     Vascular: No JVD.     Trachea: No tracheal deviation.  Cardiovascular:     Rate and Rhythm: Normal rate and regular rhythm.     Heart sounds: Normal heart sounds. No murmur heard.    No friction rub. No gallop.  Pulmonary:     Effort: Pulmonary effort is normal. No respiratory distress.     Breath sounds: No wheezing or rales.  Chest:     Chest wall: No tenderness.  Musculoskeletal:        General: Normal range of motion.  Skin:    General: Skin is warm and dry.  Neurological:     Mental Status: She is alert and oriented to person, place, and time.  Psychiatric:        Behavior: Behavior normal.        Thought Content: Thought content normal.        Judgment: Judgment normal.        Assessment/Plan: 1. Family history of brain aneurysm (Primary) Will order updated imaging due to very significant Fhx of brain aneurysms in 4 family members with 3 having  had ruptures.  - MR Angiogram Head Wo Contrast; Future  2. Episodic lightheadedness - MR Angiogram Head Wo Contrast; Future  3. Thyroid disorder screen - TSH + free T4  4. Mixed hyperlipidemia - Lipid Panel With LDL/HDL Ratio  5. B12 deficiency - B12 and Folate Panel  6. Other fatigue - CBC w/Diff/Platelet - Comprehensive metabolic panel with GFR - Lipid Panel With LDL/HDL Ratio - TSH + free T4   General Counseling: Kalen verbalizes understanding of the findings of todays visit and agrees with plan of treatment. I have  discussed any further diagnostic evaluation that may be needed or ordered today. We also reviewed her medications today. she has been encouraged to call the office with any questions or concerns that should arise related to todays visit.    Orders Placed This Encounter  Procedures   MR Angiogram Head Wo Contrast   CBC w/Diff/Platelet   Comprehensive metabolic panel with GFR   Lipid Panel With LDL/HDL Ratio   TSH + free T4   B12 and Folate Panel    No orders of the defined types were placed in this encounter.   This patient was seen by Tinnie Pro, PA-C in collaboration with Dr. Sigrid Bathe as a part of collaborative care agreement.   Total time spent:30 Minutes Time spent includes review of chart, medications, test results, and follow up plan with the patient.      Dr Fozia M Khan Internal medicine

## 2024-11-22 ENCOUNTER — Other Ambulatory Visit: Payer: Self-pay | Admitting: Medical Genetics

## 2024-11-22 DIAGNOSIS — Z006 Encounter for examination for normal comparison and control in clinical research program: Secondary | ICD-10-CM

## 2024-11-29 ENCOUNTER — Encounter: Payer: Self-pay | Admitting: Physician Assistant

## 2024-12-02 ENCOUNTER — Inpatient Hospital Stay: Admission: RE | Admit: 2024-12-02 | Source: Ambulatory Visit

## 2024-12-02 DIAGNOSIS — R42 Dizziness and giddiness: Secondary | ICD-10-CM

## 2024-12-02 DIAGNOSIS — Z8249 Family history of ischemic heart disease and other diseases of the circulatory system: Secondary | ICD-10-CM

## 2025-05-18 ENCOUNTER — Encounter: Admitting: Physician Assistant

## 2025-05-29 ENCOUNTER — Encounter: Admitting: Physician Assistant
